# Patient Record
Sex: Female | Born: 1975 | Race: Black or African American | Hispanic: No | Marital: Single | Smoking: Never smoker
Health system: Southern US, Community
[De-identification: ages and names within clinical notes are randomized; demographics above are authoritative.]

## PROBLEM LIST (undated history)

## (undated) DIAGNOSIS — I10 Essential (primary) hypertension: Secondary | ICD-10-CM

## (undated) DIAGNOSIS — J189 Pneumonia, unspecified organism: Secondary | ICD-10-CM

## (undated) DIAGNOSIS — I639 Cerebral infarction, unspecified: Secondary | ICD-10-CM

## (undated) DIAGNOSIS — T7840XA Allergy, unspecified, initial encounter: Secondary | ICD-10-CM

## (undated) DIAGNOSIS — Z9289 Personal history of other medical treatment: Secondary | ICD-10-CM

## (undated) DIAGNOSIS — D649 Anemia, unspecified: Secondary | ICD-10-CM

## (undated) DIAGNOSIS — R87619 Unspecified abnormal cytological findings in specimens from cervix uteri: Secondary | ICD-10-CM

## (undated) DIAGNOSIS — N63 Unspecified lump in unspecified breast: Secondary | ICD-10-CM

## (undated) DIAGNOSIS — R51 Headache: Secondary | ICD-10-CM

## (undated) DIAGNOSIS — G43909 Migraine, unspecified, not intractable, without status migrainosus: Secondary | ICD-10-CM

## (undated) HISTORY — DX: Unspecified abnormal cytological findings in specimens from cervix uteri: R87.619

## (undated) HISTORY — DX: Anemia, unspecified: D64.9

## (undated) HISTORY — DX: Personal history of other medical treatment: Z92.89

## (undated) HISTORY — DX: Cerebral infarction, unspecified: I63.9

## (undated) HISTORY — DX: Unspecified lump in unspecified breast: N63.0

## (undated) HISTORY — DX: Migraine, unspecified, not intractable, without status migrainosus: G43.909

## (undated) HISTORY — DX: Allergy, unspecified, initial encounter: T78.40XA

## (undated) HISTORY — PX: WISDOM TOOTH EXTRACTION: SHX21

---

## 2002-04-02 HISTORY — PX: COLPOSCOPY: SHX161

## 2007-06-24 ENCOUNTER — Emergency Department (HOSPITAL_COMMUNITY): Admission: EM | Admit: 2007-06-24 | Discharge: 2007-06-25 | Payer: Self-pay | Admitting: Emergency Medicine

## 2008-01-16 ENCOUNTER — Ambulatory Visit: Payer: Self-pay | Admitting: Nurse Practitioner

## 2008-01-16 DIAGNOSIS — D649 Anemia, unspecified: Secondary | ICD-10-CM

## 2008-01-19 ENCOUNTER — Encounter (INDEPENDENT_AMBULATORY_CARE_PROVIDER_SITE_OTHER): Payer: Self-pay | Admitting: Nurse Practitioner

## 2008-01-19 LAB — CONVERTED CEMR LAB
Basophils Absolute: 0 10*3/uL (ref 0.0–0.1)
Basophils Relative: 0 % (ref 0–1)
Eosinophils Relative: 1 % (ref 0–5)
HCT: 34.8 % — ABNORMAL LOW (ref 36.0–46.0)
Hemoglobin: 10.8 g/dL — ABNORMAL LOW (ref 12.0–15.0)
Iron: 38 ug/dL — ABNORMAL LOW (ref 42–145)
MCHC: 31 g/dL (ref 30.0–36.0)
Monocytes Absolute: 0.5 10*3/uL (ref 0.1–1.0)
Neutro Abs: 4.1 10*3/uL (ref 1.7–7.7)
RDW: 14.5 % (ref 11.5–15.5)
TIBC: 484 ug/dL — ABNORMAL HIGH (ref 250–470)

## 2008-04-02 DIAGNOSIS — N63 Unspecified lump in unspecified breast: Secondary | ICD-10-CM

## 2008-04-02 HISTORY — DX: Unspecified lump in unspecified breast: N63.0

## 2008-05-13 ENCOUNTER — Ambulatory Visit: Payer: Self-pay | Admitting: Nurse Practitioner

## 2008-05-13 DIAGNOSIS — N764 Abscess of vulva: Secondary | ICD-10-CM

## 2008-05-13 DIAGNOSIS — T148XXA Other injury of unspecified body region, initial encounter: Secondary | ICD-10-CM | POA: Insufficient documentation

## 2008-05-13 LAB — CONVERTED CEMR LAB
Basophils Relative: 0 % (ref 0–1)
Eosinophils Absolute: 0 10*3/uL (ref 0.0–0.7)
Eosinophils Relative: 0 % (ref 0–5)
HCT: 35.9 % — ABNORMAL LOW (ref 36.0–46.0)
Hemoglobin: 11.3 g/dL — ABNORMAL LOW (ref 12.0–15.0)
MCHC: 31.5 g/dL (ref 30.0–36.0)
MCV: 85.5 fL (ref 78.0–100.0)
Monocytes Absolute: 0.4 10*3/uL (ref 0.1–1.0)
Monocytes Relative: 7 % (ref 3–12)
Neutro Abs: 3 10*3/uL (ref 1.7–7.7)
RBC: 4.2 M/uL (ref 3.87–5.11)

## 2008-05-14 ENCOUNTER — Encounter (INDEPENDENT_AMBULATORY_CARE_PROVIDER_SITE_OTHER): Payer: Self-pay | Admitting: Nurse Practitioner

## 2008-06-30 ENCOUNTER — Ambulatory Visit: Payer: Self-pay | Admitting: Nurse Practitioner

## 2008-06-30 ENCOUNTER — Encounter (INDEPENDENT_AMBULATORY_CARE_PROVIDER_SITE_OTHER): Payer: Self-pay | Admitting: Nurse Practitioner

## 2008-06-30 ENCOUNTER — Other Ambulatory Visit: Admission: RE | Admit: 2008-06-30 | Discharge: 2008-06-30 | Payer: Self-pay | Admitting: Internal Medicine

## 2008-06-30 DIAGNOSIS — N63 Unspecified lump in unspecified breast: Secondary | ICD-10-CM

## 2008-06-30 LAB — CONVERTED CEMR LAB
ALT: 10 U/L
AST: 17 U/L
Albumin: 4.4 g/dL
Alkaline Phosphatase: 42 U/L
BUN: 13 mg/dL
Basophils Absolute: 0 K/uL
Basophils Relative: 0 %
Bilirubin Urine: NEGATIVE
CO2: 21 meq/L
Calcium: 9.7 mg/dL
Chlamydia, DNA Probe: NEGATIVE
Chloride: 108 meq/L
Cholesterol: 187 mg/dL
Creatinine, Ser: 1.01 mg/dL
Eosinophils Absolute: 0.1 K/uL
Eosinophils Relative: 1 %
GC Probe Amp, Genital: NEGATIVE
Glucose, Bld: 87 mg/dL
Glucose, Urine, Semiquant: NEGATIVE
HCT: 36 %
HDL: 59 mg/dL
Hemoglobin: 12.2 g/dL
Iron: 128 ug/dL
KOH Prep: NEGATIVE
Ketones, urine, test strip: NEGATIVE
LDL Cholesterol: 115 mg/dL — ABNORMAL HIGH
Lymphocytes Relative: 38 %
Lymphs Abs: 2.2 K/uL
MCHC: 33.9 g/dL
MCV: 85.9 fL
Monocytes Absolute: 0.4 K/uL
Monocytes Relative: 7 %
Neutro Abs: 3 K/uL
Neutrophils Relative %: 54 %
Nitrite: NEGATIVE
Platelets: 233 K/uL
Potassium: 4.3 meq/L
RBC: 4.19 M/uL
RDW: 15.6 % — ABNORMAL HIGH
Saturation Ratios: 32 %
Sodium: 141 meq/L
TIBC: 397 ug/dL
TSH: 0.96 u[IU]/mL
Total Bilirubin: 1.1 mg/dL
Total CHOL/HDL Ratio: 3.2
Total Protein: 7.9 g/dL
Triglycerides: 64 mg/dL
UIBC: 269 ug/dL
VLDL: 13 mg/dL
WBC: 5.7 10*3/microliter
pH: 6

## 2008-07-01 ENCOUNTER — Encounter (INDEPENDENT_AMBULATORY_CARE_PROVIDER_SITE_OTHER): Payer: Self-pay | Admitting: Nurse Practitioner

## 2008-07-01 ENCOUNTER — Telehealth (INDEPENDENT_AMBULATORY_CARE_PROVIDER_SITE_OTHER): Payer: Self-pay | Admitting: Nurse Practitioner

## 2008-07-06 ENCOUNTER — Encounter: Admission: RE | Admit: 2008-07-06 | Discharge: 2008-07-06 | Payer: Self-pay | Admitting: Family Medicine

## 2008-11-13 ENCOUNTER — Emergency Department (HOSPITAL_COMMUNITY): Admission: EM | Admit: 2008-11-13 | Discharge: 2008-11-13 | Payer: Self-pay | Admitting: Emergency Medicine

## 2008-12-31 ENCOUNTER — Ambulatory Visit: Payer: Self-pay | Admitting: Nurse Practitioner

## 2008-12-31 DIAGNOSIS — N3000 Acute cystitis without hematuria: Secondary | ICD-10-CM | POA: Insufficient documentation

## 2008-12-31 LAB — CONVERTED CEMR LAB
Glucose, Urine, Semiquant: NEGATIVE
Nitrite: NEGATIVE
Protein, U semiquant: >=300
Specific Gravity, Urine: 1.025
pH: 7

## 2009-01-01 ENCOUNTER — Encounter (INDEPENDENT_AMBULATORY_CARE_PROVIDER_SITE_OTHER): Payer: Self-pay | Admitting: Nurse Practitioner

## 2009-02-10 ENCOUNTER — Ambulatory Visit: Payer: Self-pay | Admitting: Nurse Practitioner

## 2009-02-10 DIAGNOSIS — R3129 Other microscopic hematuria: Secondary | ICD-10-CM

## 2009-02-10 LAB — CONVERTED CEMR LAB
Ketones, urine, test strip: NEGATIVE
Nitrite: NEGATIVE
Specific Gravity, Urine: 1.01
Urobilinogen, UA: 0.2
WBC Urine, dipstick: NEGATIVE

## 2009-02-12 ENCOUNTER — Encounter (INDEPENDENT_AMBULATORY_CARE_PROVIDER_SITE_OTHER): Payer: Self-pay | Admitting: Nurse Practitioner

## 2009-03-18 ENCOUNTER — Telehealth (INDEPENDENT_AMBULATORY_CARE_PROVIDER_SITE_OTHER): Payer: Self-pay | Admitting: Nurse Practitioner

## 2009-03-22 ENCOUNTER — Telehealth (INDEPENDENT_AMBULATORY_CARE_PROVIDER_SITE_OTHER): Payer: Self-pay | Admitting: Nurse Practitioner

## 2009-04-15 ENCOUNTER — Ambulatory Visit: Payer: Self-pay | Admitting: Internal Medicine

## 2009-04-15 LAB — CONVERTED CEMR LAB
Glucose, Urine, Semiquant: NEGATIVE
Nitrite: NEGATIVE
Protein, U semiquant: NEGATIVE
Specific Gravity, Urine: 1.02
Urobilinogen, UA: 0.2

## 2009-04-16 ENCOUNTER — Encounter (INDEPENDENT_AMBULATORY_CARE_PROVIDER_SITE_OTHER): Payer: Self-pay | Admitting: Nurse Practitioner

## 2009-04-18 ENCOUNTER — Encounter (INDEPENDENT_AMBULATORY_CARE_PROVIDER_SITE_OTHER): Payer: Self-pay | Admitting: Nurse Practitioner

## 2009-04-19 ENCOUNTER — Ambulatory Visit: Payer: Self-pay | Admitting: Nurse Practitioner

## 2009-04-20 ENCOUNTER — Encounter (INDEPENDENT_AMBULATORY_CARE_PROVIDER_SITE_OTHER): Payer: Self-pay | Admitting: Nurse Practitioner

## 2009-04-26 ENCOUNTER — Telehealth (INDEPENDENT_AMBULATORY_CARE_PROVIDER_SITE_OTHER): Payer: Self-pay | Admitting: Nurse Practitioner

## 2009-04-26 ENCOUNTER — Ambulatory Visit (HOSPITAL_COMMUNITY): Admission: RE | Admit: 2009-04-26 | Discharge: 2009-04-26 | Payer: Self-pay | Admitting: Internal Medicine

## 2009-04-26 DIAGNOSIS — D259 Leiomyoma of uterus, unspecified: Secondary | ICD-10-CM

## 2009-06-09 ENCOUNTER — Encounter (INDEPENDENT_AMBULATORY_CARE_PROVIDER_SITE_OTHER): Payer: Self-pay | Admitting: Nurse Practitioner

## 2009-06-15 ENCOUNTER — Encounter (INDEPENDENT_AMBULATORY_CARE_PROVIDER_SITE_OTHER): Payer: Self-pay | Admitting: Nurse Practitioner

## 2010-05-02 NOTE — Letter (Signed)
Summary: MED/SOLUTIONS/APPROVED  MED/SOLUTIONS/APPROVED   Imported By: Arta Bruce 05/30/2009 12:43:32  _____________________________________________________________________  External Attachment:    Type:   Image     Comment:   External Document

## 2010-05-02 NOTE — Letter (Signed)
[facility]   Imported By: Arta Bruce 08/15/2009 15:40:58  _____________________________________________________________________  External Attachment:    Type:   Image     Comment:   External Document

## 2010-05-02 NOTE — Letter (Signed)
[facility]   Imported By: Arta Bruce 09/01/2009 12:55:28  _____________________________________________________________________  External Attachment:    Type:   Image     Comment:   External Document

## 2010-05-02 NOTE — Miscellaneous (Signed)
Summary: Dx update - Urology  Clinical Lists Changes Full visit to be scanned in EMR Medications: Added new medication of METHENAMINE MANDELATE 1 GM TABS (METHENAMINE MANDELATE) Oe tablet by mouth two times a day Added new medication of MACROBID 100 MG CAPS (NITROFURANTOIN MONOHYD MACRO) One tablet by mouth once daily

## 2010-05-02 NOTE — Progress Notes (Signed)
Summary: Referral needed  Phone Note Outgoing Call   Summary of Call: I notifed pt that CT of the abd/pelvis did not show any cause for recurrent UTI's.  Will proceed with referral to urology. She did however have a small fibroid in her uterus which would not be the cause and no need for further workup for it at this time. she will be notified of the time/date of appt. (referral in basket) **Spoke with pt directly** Initial call taken by: Lehman Prom FNP,  April 26, 2009 3:53 PM  Follow-up for Phone Call        spoke with pt and she is informed of information above. Follow-up by: Levon Hedger,  April 29, 2009 3:27 PM  New Problems: FIBROIDS, UTERUS (ICD-218.9)   New Problems: FIBROIDS, UTERUS (ICD-218.9)

## 2010-05-02 NOTE — Letter (Signed)
Summary: MED/SOLUTION//APPROVED  MED/SOLUTION//APPROVED   Imported By: Arta Bruce 05/31/2009 14:43:46  _____________________________________________________________________  External Attachment:    Type:   Image     Comment:   External Document

## 2010-05-02 NOTE — Letter (Signed)
Summary: TEST ORDER FORM/CT//ACUTE CYSTITIS//APPT DATE & TIME  TEST ORDER FORM/CT//ACUTE CYSTITIS//APPT DATE & TIME   Imported By: Arta Bruce 06/01/2009 11:49:51  _____________________________________________________________________  External Attachment:    Type:   Image     Comment:   External Document

## 2010-07-08 LAB — URINALYSIS, MICROSCOPIC ONLY
Bilirubin Urine: NEGATIVE
Glucose, UA: NEGATIVE mg/dL
Ketones, ur: NEGATIVE mg/dL
Nitrite: NEGATIVE
Protein, ur: NEGATIVE mg/dL
Specific Gravity, Urine: 1.006 (ref 1.005–1.030)
pH: 8 (ref 5.0–8.0)

## 2010-07-08 LAB — POCT URINALYSIS DIP (DEVICE)
Nitrite: NEGATIVE
Protein, ur: NEGATIVE mg/dL
pH: 7.5 (ref 5.0–8.0)

## 2010-07-08 LAB — URINE CULTURE

## 2010-07-08 LAB — POCT PREGNANCY, URINE: Preg Test, Ur: NEGATIVE

## 2011-05-09 ENCOUNTER — Other Ambulatory Visit: Payer: Self-pay | Admitting: Obstetrics and Gynecology

## 2011-05-09 DIAGNOSIS — N63 Unspecified lump in unspecified breast: Secondary | ICD-10-CM

## 2011-05-11 ENCOUNTER — Other Ambulatory Visit: Payer: Self-pay

## 2011-05-18 ENCOUNTER — Ambulatory Visit
Admission: RE | Admit: 2011-05-18 | Discharge: 2011-05-18 | Disposition: A | Discharge status: Home / Self Care | Payer: 59 | Source: Ambulatory Visit | Attending: Obstetrics and Gynecology | Admitting: Obstetrics and Gynecology

## 2011-05-18 DIAGNOSIS — N63 Unspecified lump in unspecified breast: Secondary | ICD-10-CM

## 2012-06-25 ENCOUNTER — Encounter: Payer: Self-pay | Admitting: Certified Nurse Midwife

## 2012-06-25 ENCOUNTER — Other Ambulatory Visit: Payer: Self-pay | Admitting: Nurse Practitioner

## 2012-06-25 DIAGNOSIS — D500 Iron deficiency anemia secondary to blood loss (chronic): Secondary | ICD-10-CM

## 2012-06-26 ENCOUNTER — Other Ambulatory Visit: Payer: Self-pay

## 2012-07-01 ENCOUNTER — Telehealth: Payer: Self-pay | Admitting: Certified Nurse Midwife

## 2012-07-01 NOTE — Telephone Encounter (Signed)
pt cx lab appt for cbc at reminder call/called pt and lm to call back to rs/[place]

## 2012-07-03 ENCOUNTER — Other Ambulatory Visit: Payer: Self-pay

## 2012-08-28 ENCOUNTER — Encounter (HOSPITAL_COMMUNITY): Payer: Self-pay | Admitting: Nurse Practitioner

## 2012-08-28 ENCOUNTER — Emergency Department (HOSPITAL_COMMUNITY)
Admission: EM | Admit: 2012-08-28 | Discharge: 2012-08-28 | Disposition: A | Discharge status: Home / Self Care | Payer: Self-pay | Attending: Emergency Medicine | Admitting: Emergency Medicine

## 2012-08-28 DIAGNOSIS — Z862 Personal history of diseases of the blood and blood-forming organs and certain disorders involving the immune mechanism: Secondary | ICD-10-CM | POA: Insufficient documentation

## 2012-08-28 DIAGNOSIS — R51 Headache: Secondary | ICD-10-CM | POA: Insufficient documentation

## 2012-08-28 DIAGNOSIS — Z8742 Personal history of other diseases of the female genital tract: Secondary | ICD-10-CM | POA: Insufficient documentation

## 2012-08-28 DIAGNOSIS — R110 Nausea: Secondary | ICD-10-CM | POA: Insufficient documentation

## 2012-08-28 DIAGNOSIS — Z79899 Other long term (current) drug therapy: Secondary | ICD-10-CM | POA: Insufficient documentation

## 2012-08-28 DIAGNOSIS — R42 Dizziness and giddiness: Secondary | ICD-10-CM | POA: Insufficient documentation

## 2012-08-28 LAB — POCT I-STAT, CHEM 8
Calcium, Ion: 1.25 mmol/L — ABNORMAL HIGH (ref 1.12–1.23)
Creatinine, Ser: 0.9 mg/dL (ref 0.50–1.10)
Glucose, Bld: 87 mg/dL (ref 70–99)
Potassium: 3.8 mEq/L (ref 3.5–5.1)

## 2012-08-28 MED ORDER — DEXAMETHASONE SODIUM PHOSPHATE 10 MG/ML IJ SOLN
10.0000 mg | Freq: Once | INTRAMUSCULAR | Status: AC
  Administered 2012-08-28: 10 mg via INTRAVENOUS
  Filled 2012-08-28: qty 1

## 2012-08-28 MED ORDER — SODIUM CHLORIDE 0.9 % IV BOLUS (SEPSIS)
1000.0000 mL | Freq: Once | INTRAVENOUS | Status: AC
  Administered 2012-08-28: 1000 mL via INTRAVENOUS

## 2012-08-28 MED ORDER — DIAZEPAM 5 MG PO TABS: 5.0000 mg | ORAL_TABLET | Freq: Four times a day (QID) | ORAL | Status: DC | PRN

## 2012-08-28 MED ORDER — KETOROLAC TROMETHAMINE 30 MG/ML IJ SOLN
30.0000 mg | Freq: Once | INTRAMUSCULAR | Status: AC
  Administered 2012-08-28: 30 mg via INTRAVENOUS
  Filled 2012-08-28: qty 1

## 2012-08-28 MED ORDER — HYDROCODONE-ACETAMINOPHEN 5-325 MG PO TABS: ORAL_TABLET | ORAL | Status: DC

## 2012-08-28 NOTE — ED Notes (Signed)
Pt presents with 5 day h/o headache.  Pt reports pain is located on the top of her head, reports area is tender to palpation.  +nausea, -photophobia;  Pt denies any blurred vision, has taken OTC medication without relief, ibuprofen taken at 0700 this am.

## 2012-08-28 NOTE — ED Provider Notes (Signed)
History     CSN: [id number]  Arrival date & time 08/28/12  5784   First MD Initiated Contact with Patient 08/28/12 1108      Chief Complaint  Patient presents with  . Headache    (Consider location/radiation/quality/duration/timing/severity/associated sxs/prior treatment) HPI  Holly Moyer is a 37 y.o. female complaining of headache on the top of her head described as pressure like and burning with TTP on top of head associated with photo and sono sensitivity, nausea , pressure behind both eyes and lightheaded sensation when going from sitting to standing.  Denies trauma, syncope, fever, neck stiffness, rash, change in vision, dysarthria, ataxia, cough, abdominal pain, chest pain, change in bowel or bladder habits. Pt has tried Motrin APAP naproxen with little releif. Indolent onset with maximal intensity (10/10) at 24-36 hours.  Patient has had prior headaches but this one is different in that it has lasted longer than others.  Past Medical History  Diagnosis Date  . Anemia   . Breast mass in female 2010    questionable mass, no f/u (fibroadenoma vs papilloma)    Past Surgical History  Procedure Laterality Date  . Colposcopy  2004  . Wisdom tooth extraction    . Cesarean section      x's 2    Family History  Problem Relation Age of Onset  . Hypertension Father   . Heart disease Father     74's  . Diabetes Maternal Grandmother   . Heart failure Maternal Grandmother   . Hypertension Paternal Grandmother   . Hypertension Paternal Grandfather     History  Substance Use Topics  . Smoking status: Never Smoker   . Smokeless tobacco: Not on file  . Alcohol Use: No    OB History   Grav Para Term Preterm Abortions TAB SAB Ect Mult Living   3 3  1      2       Review of Systems  Constitutional: Negative for fever.  HENT: Negative for neck pain and neck stiffness.   Eyes: Negative for visual disturbance.  Respiratory: Negative for shortness of breath.    Cardiovascular: Negative for chest pain.  Gastrointestinal: Negative for nausea, vomiting, abdominal pain and diarrhea.  Neurological: Positive for light-headedness and headaches. Negative for syncope, speech difficulty and weakness.  All other systems reviewed and are negative.    Allergies  Macrobid  Home Medications   Current Outpatient Rx  Name  Route  Sig  Dispense  Refill  . Ferrous Sulfate (IRON) 325 (65 FE) MG TABS   Oral   Take 1-2 tablets by mouth daily.         Marland Kitchen ibuprofen (ADVIL,MOTRIN) 200 MG tablet   Oral   Take 200-600 mg by mouth 3 (three) times daily as needed for pain.           BP 163/91  Pulse 84  Temp(Src) 98.4 F (36.9 C) (Oral)  Resp 18  Ht 5\' 4"  (1.626 m)  Wt 185 lb (83.915 kg)  BMI 31.74 kg/m2  SpO2 100%  Physical Exam  Nursing note and vitals reviewed. Constitutional: She is oriented to person, place, and time. She appears well-developed and well-nourished. No distress.  HENT:  Head: Normocephalic.  Mouth/Throat: Oropharynx is clear and moist.  Eyes: Conjunctivae and EOM are normal. Pupils are equal, round, and reactive to light.  Neck:  Patient has full range of motion to neck. No tenderness to deep palpation of the posterior cervical spine. Patient can flex chin  to chest with no pain.   Cardiovascular: Normal rate, regular rhythm and normal heart sounds.   Pulmonary/Chest: Effort normal and breath sounds normal. No stridor. No respiratory distress. She has no wheezes. She has no rales.  Abdominal: Soft. There is no tenderness.  Musculoskeletal: Normal range of motion.  Neurological: She is alert and oriented to person, place, and time.  Cranial nerves III through XII intact, strength 5 out of 5x4 extremities, negative pronator drift, finger to nose and heel-to-shin coordinated, sensation intact to pinprick and light touch, gait is coordinated and Romberg is negative.    Skin:  Patient is slightly swollen to top of head with  tenderness to palpation, no signs of trauma, no lymphadenopathy.  Psychiatric: She has a normal mood and affect.    ED Course  Procedures (including critical care time)  Labs Reviewed  POCT I-STAT, CHEM 8 - Abnormal; Notable for the following:    Calcium, Ion 1.25 (*)    Hemoglobin 8.5 (*)    HCT 25.0 (*)    All other components within normal limits  POCT PREGNANCY, URINE   No results found.   1. Headache       MDM    Filed Vitals:   08/28/12 1004  BP: 163/91  Pulse: 84  Temp: 98.4 F (36.9 C)  TempSrc: Oral  Resp: 18  Height: 5\' 4"  (1.626 m)  Weight: 185 lb (83.915 kg)  SpO2: 100%     Holly Moyer is a 37 y.o. female   Non concerning for SAH, ICH, Meningitis, or temporal arteritis. Pt is afebrile with no focal neuro deficits, nuchal rigidity, or change in vision. Patient refuses any pain medication will make her drowsy.  Patient has edematous skin to top of cranium, and her pain is more superficial and coming from the skin rather than true headache. Discussed case with attending who agrees with plan and stability to d/c to home. Pt verbalizes understanding and is agreeable with plan to dc.    Medications  sodium chloride 0.9 % bolus 1,000 mL (0 mLs Intravenous Stopped 08/28/12 1344)  dexamethasone (DECADRON) injection 10 mg (10 mg Intravenous Given 08/28/12 1306)  ketorolac (TORADOL) 30 MG/ML injection 30 mg (30 mg Intravenous Given 08/28/12 1303)    The patient is hemodynamically stable, appropriate for, and amenable to, discharge at this time. Pt verbalized understanding and agrees with care plan. Outpatient follow-up and return precautions given.    New Prescriptions   DIAZEPAM (VALIUM) 5 MG TABLET    Take 1 tablet (5 mg total) by mouth every 6 (six) hours as needed (muscle spasms).   HYDROCODONE-ACETAMINOPHEN (NORCO/VICODIN) 5-325 MG PER TABLET    Take 1-2 tablets by mouth every 6 hours as needed for pain.           Wynetta Emery, PA-C 08/28/12  1825

## 2012-08-28 NOTE — ED Notes (Addendum)
Headache since Sunday, unrelieved with otc meds. Has felt nauseated and dizzy also. Hx headaches but they usually dont last this olong. A&Ox4, resp e/u

## 2012-09-01 NOTE — ED Provider Notes (Signed)
Medical screening examination/treatment/procedure(s) were performed by non-physician practitioner and as supervising physician I was immediately available for consultation/collaboration.   Loren Racer, MD 09/01/12 [phone]

## 2013-02-19 ENCOUNTER — Emergency Department (HOSPITAL_COMMUNITY): Payer: BC Managed Care – PPO

## 2013-02-19 ENCOUNTER — Observation Stay (HOSPITAL_COMMUNITY)
Admission: EM | Admit: 2013-02-19 | Discharge: 2013-02-20 | Disposition: A | Discharge status: Home / Self Care | Payer: BC Managed Care – PPO | Attending: Internal Medicine | Admitting: Internal Medicine

## 2013-02-19 ENCOUNTER — Encounter (HOSPITAL_COMMUNITY): Payer: Self-pay | Admitting: Emergency Medicine

## 2013-02-19 DIAGNOSIS — R079 Chest pain, unspecified: Secondary | ICD-10-CM | POA: Diagnosis present

## 2013-02-19 DIAGNOSIS — N63 Unspecified lump in unspecified breast: Secondary | ICD-10-CM

## 2013-02-19 DIAGNOSIS — D649 Anemia, unspecified: Principal | ICD-10-CM | POA: Diagnosis present

## 2013-02-19 DIAGNOSIS — D259 Leiomyoma of uterus, unspecified: Secondary | ICD-10-CM

## 2013-02-19 DIAGNOSIS — R0609 Other forms of dyspnea: Secondary | ICD-10-CM | POA: Diagnosis present

## 2013-02-19 DIAGNOSIS — R0989 Other specified symptoms and signs involving the circulatory and respiratory systems: Secondary | ICD-10-CM | POA: Insufficient documentation

## 2013-02-19 DIAGNOSIS — T148XXA Other injury of unspecified body region, initial encounter: Secondary | ICD-10-CM

## 2013-02-19 DIAGNOSIS — N898 Other specified noninflammatory disorders of vagina: Secondary | ICD-10-CM | POA: Insufficient documentation

## 2013-02-19 DIAGNOSIS — R5381 Other malaise: Secondary | ICD-10-CM | POA: Insufficient documentation

## 2013-02-19 DIAGNOSIS — N920 Excessive and frequent menstruation with regular cycle: Secondary | ICD-10-CM | POA: Diagnosis present

## 2013-02-19 DIAGNOSIS — N764 Abscess of vulva: Secondary | ICD-10-CM

## 2013-02-19 DIAGNOSIS — R3129 Other microscopic hematuria: Secondary | ICD-10-CM

## 2013-02-19 DIAGNOSIS — R51 Headache: Secondary | ICD-10-CM | POA: Insufficient documentation

## 2013-02-19 DIAGNOSIS — R0602 Shortness of breath: Secondary | ICD-10-CM | POA: Insufficient documentation

## 2013-02-19 DIAGNOSIS — N3000 Acute cystitis without hematuria: Secondary | ICD-10-CM

## 2013-02-19 HISTORY — DX: Pneumonia, unspecified organism: J18.9

## 2013-02-19 HISTORY — DX: Headache: R51

## 2013-02-19 HISTORY — DX: Essential (primary) hypertension: I10

## 2013-02-19 LAB — POCT I-STAT TROPONIN I: Troponin i, poc: 0 ng/mL (ref 0.00–0.08)

## 2013-02-19 LAB — BASIC METABOLIC PANEL
BUN: 12 mg/dL (ref 6–23)
CO2: 25 mEq/L (ref 19–32)
Calcium: 9.2 mg/dL (ref 8.4–10.5)
Glucose, Bld: 88 mg/dL (ref 70–99)
Potassium: 3.7 mEq/L (ref 3.5–5.1)
Sodium: 140 mEq/L (ref 135–145)

## 2013-02-19 LAB — PREPARE RBC (CROSSMATCH)

## 2013-02-19 LAB — CBC
HCT: 20.3 % — ABNORMAL LOW (ref 36.0–46.0)
Hemoglobin: 5.7 g/dL — CL (ref 12.0–15.0)
MCH: 16.4 pg — ABNORMAL LOW (ref 26.0–34.0)
RBC: 3.48 MIL/uL — ABNORMAL LOW (ref 3.87–5.11)

## 2013-02-19 LAB — ABO/RH: ABO/RH(D): A POS

## 2013-02-19 MED ORDER — PANTOPRAZOLE SODIUM 40 MG IV SOLR
40.0000 mg | INTRAVENOUS | Status: DC
  Administered 2013-02-20: 40 mg via INTRAVENOUS
  Filled 2013-02-19 (×2): qty 40

## 2013-02-19 MED ORDER — ACETAMINOPHEN 325 MG PO TABS: 650.0000 mg | ORAL_TABLET | Freq: Four times a day (QID) | ORAL | Status: DC | PRN

## 2013-02-19 MED ORDER — VITAMIN B-12 1000 MCG PO TABS
1000.0000 ug | ORAL_TABLET | Freq: Every day | ORAL | Status: DC
  Administered 2013-02-20: 1000 ug via ORAL
  Filled 2013-02-19: qty 1

## 2013-02-19 MED ORDER — ACETAMINOPHEN 650 MG RE SUPP: 650.0000 mg | Freq: Four times a day (QID) | RECTAL | Status: DC | PRN

## 2013-02-19 MED ORDER — FERROUS SULFATE 325 (65 FE) MG PO TABS
325.0000 mg | ORAL_TABLET | Freq: Three times a day (TID) | ORAL | Status: DC
  Administered 2013-02-20 (×2): 325 mg via ORAL
  Filled 2013-02-19 (×4): qty 1

## 2013-02-19 MED ORDER — FOLIC ACID 1 MG PO TABS
1.0000 mg | ORAL_TABLET | Freq: Every day | ORAL | Status: DC
  Administered 2013-02-20: 1 mg via ORAL
  Filled 2013-02-19: qty 1

## 2013-02-19 MED ORDER — SODIUM CHLORIDE 0.9 % IJ SOLN
3.0000 mL | Freq: Two times a day (BID) | INTRAMUSCULAR | Status: DC
  Administered 2013-02-20 (×2): 3 mL via INTRAVENOUS

## 2013-02-19 NOTE — H&P (Signed)
[facility] History and Physical  Patient: Holly Moyer  RUE:[id number]  DOB: 1975-07-04  DOS: the patient was seen and examined on 02/19/2013 PCP: No PCP Per Patient  Chief Complaint: Shortness of breath and chest pain  HPI: Holly Moyer is a 37 y.o. female with Past medical history of iron deficiency anemia secondary to menstruation was. The patient is coming from home. The patient is presenting with complaints of shortness of breath on exertion associated with chest pain that has been ongoing since last few weeks and progressively worsening. She mentions that the pain was severe today located in her center of the chest and radiating to her neck and arm and felt like pressure and cramps. She also had shortness of breath which was also progressing. She denied any fever, chills, cough, nausea, vomiting, diarrhea, active bleeding, black color bowel movements, blood in urine, leg swelling, orthopnea, PND. She mentions that she had on and off similar symptoms in the past but not to this extent. She mentions that 13 years ago after her pregnancy she was recommended iron supplements for iron deficiency anemia. She mentions that she continues to have heavy menstrual periods requiring pads changing every few hours initially. She mentions that since last 6 months he has not been regularly taking the iron supplements.  Review of Systems: as mentioned in the history of present illness.  A Comprehensive review of the other systems is negative.  Past Medical History  Diagnosis Date  . Anemia   . Breast mass in female 2010    questionable mass, no f/u (fibroadenoma vs papilloma)   Past Surgical History  Procedure Laterality Date  . Colposcopy  2004  . Wisdom tooth extraction    . Cesarean section      x's 2   Social History:  reports that she has never smoked. She does not have any smokeless tobacco history on file. She reports that she does not drink alcohol or use illicit  drugs. Independent for most of her  ADL.  Allergies  Allergen Reactions  . Macrobid [Nitrofurantoin Macrocrystal] Itching    Family History  Problem Relation Age of Onset  . Hypertension Father   . Heart disease Father     67's  . Diabetes Maternal Grandmother   . Heart failure Maternal Grandmother   . Hypertension Paternal Grandmother   . Hypertension Paternal Grandfather     Prior to Admission medications   Medication Sig Start Date End Date Taking? Authorizing Provider  acetaminophen (TYLENOL) 500 MG tablet Take 1,000 mg by mouth every 6 (six) hours as needed for moderate pain.   Yes Historical Provider, MD  aspirin EC 81 MG tablet Take 81 mg by mouth daily.   Yes Historical Provider, MD  GARLIC PO Take 1 tablet by mouth daily.   Yes Historical Provider, MD  ibuprofen (ADVIL,MOTRIN) 200 MG tablet Take 400 mg by mouth daily as needed.    Yes Historical Provider, MD    Physical Exam: Filed Vitals:   02/19/13 1733 02/19/13 1902 02/19/13 2000 02/19/13 2020  BP: 133/86 130/81 121/75   Pulse: 89 76 72   Temp: 99.1 F (37.3 C)   98.2 F (36.8 C)  TempSrc: Oral   Oral  Resp: 16 18 14    SpO2: 100%  100%     General: Alert, Awake and Oriented to Time, Place and Person. Appear in moderate distress Eyes: PERRL ENT: Oral Mucosa clear, pallor evident . Neck: No  JVD Cardiovascular: S1 and S2 Present, no  Murmur, Peripheral Pulses Present Respiratory: Bilateral Air entry equal and Decreased, Clear to Auscultation,  No  Crackles,no  wheezes Abdomen: Bowel Sound Present, Soft and Non tender Skin: No  Rash Extremities: No  Pedal edema, no  calf tenderness Neurologic: Grossly Unremarkable.  Labs on Admission:  CBC:  Recent Labs Lab 02/19/13 1738  WBC 8.2  HGB 5.7*  HCT 20.3*  MCV 58.3*  PLT 648*    CMP     Component Value Date/Time   NA 140 02/19/2013 1738   K 3.7 02/19/2013 1738   CL 105 02/19/2013 1738   CO2 25 02/19/2013 1738   GLUCOSE 88 02/19/2013 1738    BUN 12 02/19/2013 1738   CREATININE 0.99 02/19/2013 1738   CALCIUM 9.2 02/19/2013 1738   PROT 7.9 06/30/2008 2156   ALBUMIN 4.4 06/30/2008 2156   AST 17 06/30/2008 2156   ALT 10 06/30/2008 2156   ALKPHOS 42 06/30/2008 2156   BILITOT 1.1 06/30/2008 2156   GFRNONAA 72* 02/19/2013 1738   GFRAA 84* 02/19/2013 1738    No results found for this basename: LIPASE, AMYLASE,  in the last 168 hours No results found for this basename: AMMONIA,  in the last 168 hours  No results found for this basename: CKTOTAL, CKMB, CKMBINDEX, TROPONINI,  in the last 168 hours BNP (last 3 results)  Recent Labs  02/19/13 1738  PROBNP 125.8*    Radiological Exams on Admission: Dg Chest 2 View  02/19/2013   CLINICAL DATA:  Left chest pain.  Back pain.  EXAM: CHEST  2 VIEW  COMPARISON:  04/26/2009  FINDINGS: The heart size and mediastinal contours are within normal limits. Both lungs are clear. The visualized skeletal structures are unremarkable.  IMPRESSION: No active cardiopulmonary disease.   Electronically Signed   By: Herbie Baltimore M.D.   On: 02/19/2013 18:37    EKG: Independently reviewed. normal EKG, normal sinus rhythm,  Assessment/Plan Principal Problem:   ANEMIA Active Problems:   Menorrhagia   Chest pain   DOE (dyspnea on exertion)   1. Anemia, unspecified the patient is presenting with chest pain and shortness of breath. Her initial troponin and EKG are negative for any acute ischemia. She is found to have severely anemic with hemoglobin of 5.7 associated with microcytosis. More likely she has iron deficiency anemia due to chronic blood loss due to menorrhagia. She denies any active bleeding from GI tract or black color bowel movements, at present she is refusing Hemoccult testing. Considering her presentation she is receiving 2 units of PRBC. Recheck in H&H after the blood transfusion. I would also check a Hemoccult of stools specimen. I will get iron, TIBC, ferritin, TSH, B12, folic acid  levels checked. I start her on iron supplements 3 times a day as well as folic acid and B12 from tomorrow.she has been recommended to remain compliant with the treatment for long-term. Considering her significant menorrhagia she may require referral to OB/GYN as an outpatient. Currently there is no evidence of GI bleeding, if Hemoccult testing is positive then she may require a GI consultation.   I will keep her on clear liquid diet until the nature of her anemia is clear.  2. chest pain and shortness of breath  Her EKG and chest x-ray does not show any acute abnormality. More likely her anemia is the cause of ongoing chest pain and shortness of breath she is currently chest pain-free , I will get serial troponins. I would also monitor on telemetry  DVT Prophylaxis: mechanical compression device Nutrition:  clear liquid diet   Code Status:  full   Family Communication:  mother  was present at bedside, opportunity was given to ask question and all questions were answered satisfactorily at the time of interview. Disposition: Admitted to inpatient in telemetry unit.  Author: Lynden Oxford, MD [facility] Pager: [phone] 02/19/2013, 8:37 PM    If 7PM-7AM, please contact night-coverage [email or web address] Password [email or web address]

## 2013-02-19 NOTE — ED Provider Notes (Signed)
CSN: [id number]     Arrival date & time 02/19/13  1728 History   First MD Initiated Contact with Patient 02/19/13 1837     Chief Complaint  Patient presents with  . Chest Pain   (Consider location/radiation/quality/duration/timing/severity/associated sxs/prior Treatment) HPI Comments: 37 year old female presents with worsening dyspnea on exertion and chest pain on exertion for the past several months. She is also noticed having a headache recently. She feels like the chest pain as a pressure in her left chest radiates to her neck and her arm. This seems to come and go with exertion. She's never felt this way before but progressed to the point where she felt she needed to be evaluated. She has a history of anemia which is supposed to be on iron but has not taken her iron pills in several months. Denies any hematochezia or melena. She has had a history of heavy vaginal bleeding that has not changed recently. Her last period was one week ago.    Past Medical History  Diagnosis Date  . Anemia   . Breast mass in female 2010    questionable mass, no f/u (fibroadenoma vs papilloma)   Past Surgical History  Procedure Laterality Date  . Colposcopy  2004  . Wisdom tooth extraction    . Cesarean section      x's 2   Family History  Problem Relation Age of Onset  . Hypertension Father   . Heart disease Father     93's  . Diabetes Maternal Grandmother   . Heart failure Maternal Grandmother   . Hypertension Paternal Grandmother   . Hypertension Paternal Grandfather    History  Substance Use Topics  . Smoking status: Never Smoker   . Smokeless tobacco: Not on file  . Alcohol Use: No   OB History   Grav Para Term Preterm Abortions TAB SAB Ect Mult Living   3 3  1      2      Review of Systems  Constitutional: Positive for fatigue.  Respiratory: Positive for shortness of breath.   Cardiovascular: Positive for chest pain.  Gastrointestinal: Negative for nausea, vomiting, abdominal pain  and blood in stool.  Genitourinary: Positive for vaginal bleeding.  Neurological: Positive for headaches.  All other systems reviewed and are negative.    Allergies  Macrobid  Home Medications   Current Outpatient Rx  Name  Route  Sig  Dispense  Refill  . acetaminophen (TYLENOL) 500 MG tablet   Oral   Take 1,000 mg by mouth every 6 (six) hours as needed for moderate pain.         Marland Kitchen aspirin EC 81 MG tablet   Oral   Take 81 mg by mouth daily.         Marland Kitchen GARLIC PO   Oral   Take 1 tablet by mouth daily.         Marland Kitchen ibuprofen (ADVIL,MOTRIN) 200 MG tablet   Oral   Take 400 mg by mouth daily as needed.           BP 130/81  Pulse 76  Temp(Src) 99.1 F (37.3 C) (Oral)  Resp 18  SpO2 100%  LMP 02/12/2013 Physical Exam  Nursing note and vitals reviewed. Constitutional: She is oriented to person, place, and time. She appears well-developed and well-nourished. No distress.  HENT:  Head: Normocephalic and atraumatic.  Right Ear: External ear normal.  Left Ear: External ear normal.  Nose: Nose normal.  Eyes: Right eye exhibits no  discharge. Left eye exhibits no discharge.  Cardiovascular: Normal rate, regular rhythm and normal heart sounds.   Pulmonary/Chest: Effort normal and breath sounds normal.  Abdominal: Soft. She exhibits no distension. There is no tenderness.  Neurological: She is alert and oriented to person, place, and time.  Skin: Skin is warm and dry.    ED Course  Procedures (including critical care time) Labs Review Labs Reviewed  CBC - Abnormal; Notable for the following:    RBC 3.48 (*)    Hemoglobin 5.7 (*)    HCT 20.3 (*)    MCV 58.3 (*)    MCH 16.4 (*)    MCHC 28.1 (*)    RDW 21.7 (*)    Platelets 648 (*)    All other components within normal limits  BASIC METABOLIC PANEL - Abnormal; Notable for the following:    GFR calc non Af Amer 72 (*)    GFR calc Af Amer 84 (*)    All other components within normal limits  PRO B NATRIURETIC  PEPTIDE - Abnormal; Notable for the following:    Pro B Natriuretic peptide (BNP) 125.8 (*)    All other components within normal limits  POCT I-STAT TROPONIN I  PREPARE RBC (CROSSMATCH)  TYPE AND SCREEN   Imaging Review Dg Chest 2 View  02/19/2013   CLINICAL DATA:  Left chest pain.  Back pain.  EXAM: CHEST  2 VIEW  COMPARISON:  04/26/2009  FINDINGS: The heart size and mediastinal contours are within normal limits. Both lungs are clear. The visualized skeletal structures are unremarkable.  IMPRESSION: No active cardiopulmonary disease.   Electronically Signed   By: Herbie Baltimore M.D.   On: 02/19/2013 18:37    EKG Interpretation    Date/Time:  Thursday February 19 2013 17:30:00 EST Ventricular Rate:  90 PR Interval:  130 QRS Duration: 72 QT Interval:  346 QTC Calculation: 423 R Axis:   81 Text Interpretation:  Normal sinus rhythm Nonspecific T wave abnormality Abnormal ECG No old tracing to compare Confirmed by Renley Gutman  MD, Clementina Mareno ([id number]) on 02/19/2013 6:36:54 PM            MDM   1. Anemia    Patient here with symptomatic anemia. Likely is a combination of her heavy periods and her being off iron. Her vital signs are normal. She is nonspecific EKG but no evidence of an MI. Will type and cross and order a blood his fusion. Due to her anemia she will need admission to the hospitalist.    Audree Camel, MD 02/19/13 2007

## 2013-02-19 NOTE — ED Notes (Signed)
Pt refused occult blood stool collected.

## 2013-02-19 NOTE — ED Notes (Signed)
Pt in c/o intermittent chest pain and shortness of breath with exertion over the last few months, states the symptoms have been consistent and have gotten progressively worse, states they are worse with exertion, states she had been putting off being evaluated for this and today tried to make a doctors appointment and they advised her to come to the emergency room. Pt states there have been no changes today in her symptoms.

## 2013-02-19 NOTE — ED Notes (Addendum)
Matthew Folks Rn informed of critical lab value. Hemoglobin 5.7

## 2013-02-20 ENCOUNTER — Observation Stay (HOSPITAL_COMMUNITY): Payer: BC Managed Care – PPO

## 2013-02-20 ENCOUNTER — Telehealth: Payer: Self-pay | Admitting: Certified Nurse Midwife

## 2013-02-20 ENCOUNTER — Encounter (HOSPITAL_COMMUNITY): Payer: Self-pay | Admitting: *Deleted

## 2013-02-20 LAB — TROPONIN I
Troponin I: <0.3 ng/mL (ref <0.30)
Troponin I: <0.3 ng/mL (ref <0.30)

## 2013-02-20 LAB — IRON AND TIBC
TIBC: 463 ug/dL (ref 250–470)
UIBC: 430 ug/dL — ABNORMAL HIGH (ref 125–400)

## 2013-02-20 LAB — VITAMIN B12: Vitamin B-12: 482 pg/mL (ref 211–911)

## 2013-02-20 LAB — FERRITIN: Ferritin: <1 ng/mL — ABNORMAL LOW (ref 10–291)

## 2013-02-20 LAB — HEMOGLOBIN AND HEMATOCRIT, BLOOD: HCT: 26.4 % — ABNORMAL LOW (ref 36.0–46.0)

## 2013-02-20 LAB — FOLATE: Folate: >20 ng/mL

## 2013-02-20 LAB — LACTATE DEHYDROGENASE: LDH: 190 U/L (ref 94–250)

## 2013-02-20 MED ORDER — DOCUSATE SODIUM 100 MG PO CAPS
100.0000 mg | ORAL_CAPSULE | Freq: Two times a day (BID) | ORAL | Status: DC
  Administered 2013-02-20: 100 mg via ORAL
  Filled 2013-02-20: qty 1

## 2013-02-20 MED ORDER — DSS 100 MG PO CAPS: 100.0000 mg | ORAL_CAPSULE | Freq: Two times a day (BID) | ORAL | Status: DC

## 2013-02-20 MED ORDER — SODIUM CHLORIDE 0.9 % IV SOLN
500.0000 mg | Freq: Once | INTRAVENOUS | Status: AC
  Administered 2013-02-20: 13:00:00 500 mg via INTRAVENOUS
  Filled 2013-02-20: qty 10

## 2013-02-20 MED ORDER — SODIUM CHLORIDE 0.9 % IV SOLN
25.0000 mg | Freq: Once | INTRAVENOUS | Status: AC
  Administered 2013-02-20: 12:00:00 25 mg via INTRAVENOUS
  Filled 2013-02-20: qty 0.5

## 2013-02-20 MED ORDER — FERROUS SULFATE 325 (65 FE) MG PO TABS: 325.0000 mg | ORAL_TABLET | Freq: Two times a day (BID) | ORAL | Status: DC

## 2013-02-20 NOTE — Telephone Encounter (Signed)
Scheduled f/u hospital visit with Dr. Farrel Gobble for Monday 11/24 at 6 PM. PUS resulted in system. Patient will be dc from hospital today.

## 2013-02-20 NOTE — Telephone Encounter (Signed)
Message left to return call to [place] at [phone].   Chart to Dr. Liliana Cline desk.

## 2013-02-20 NOTE — Telephone Encounter (Signed)
Please pull her chart, there is nothing in Epic

## 2013-02-20 NOTE — Telephone Encounter (Signed)
Holly Moyer from [facility] called to inform us this mutual patient will need an appointment with [place] at [facility] in the next to weeks for follow up on menorrhagia and low hemoglobin. The patient was recently seen at the ED for chest pain, menorrhagia, and low hemoglobin. Please call patient to schedule an appointment?

## 2013-02-20 NOTE — Discharge Summary (Signed)
Physician Discharge Summary  Danel Studzinski WJX:[id number] DOB: 03/25/1976 DOA: 02/19/2013  PCP: No PCP Per Patient  Admit date: 02/19/2013 Discharge date: 02/20/2013  Time spent: 30 minutes  Recommendations for Outpatient Follow-up:  Patient with chest pain from severe anemia.   Anemia felt to be due to uterine fibroids.  Patient needs GYN recommendations.   Discharge Diagnoses:  Principal Problem:   ANEMIA Active Problems:   Menorrhagia   Chest pain   DOE (dyspnea on exertion)   Discharge Condition: stable.  Diet recommendation: regular diet  Filed Weights   02/19/13 2154  Weight: 80.4 kg (177 lb 4 oz)    History of present illness at the time of admission:  Holly Moyer is a 37 y.o. female with past medical history of iron deficiency anemia secondary to menstruation.   The patient is presenting with complaints of shortness of breath on exertion associated with chest pain that has been ongoing since last few weeks and progressively worsening. She mentions that the pain was severe today located in her center of the chest and radiating to her neck and arm and it felt like pressure and cramps. She denied any fever, chills, cough, nausea, vomiting, diarrhea, active bleeding, black color bowel movements, blood in urine, leg swelling, orthopnea, PND. She mentions that similar symptoms in the past but not to this extent.  She 13 years ago after her pregnancy she was prescribed iron supplements for iron deficiency anemia. She mentions that she continues to have heavy menstrual periods requiring pads changing every few hours initially.  In last 6 months she has not been taking iron supplements.    Hospital Course:   Chest Pain - Secondary to severe symptomatic anemia -Resolved with transfusion of two units of PRBCs. -Troponin was normal x 3 -EKG was abnormal, but no specific ischemia.  Anemia -Secondary to dysfunctional uterine bleeding from fibroids -Anemia panel pending at  the time of discharge. -Patient received 2 units of PRBCs, as well as IV iron. -Pelvic and transvaginal ultrasound completed.  Revealed multiple fibroids.  Results below. -Patient's GYN office was contacted, they will call patient for a quick follow up appointment.  Uterine Fibroids -as above-likely cause of anemia,secondary to menorrhagia  Discharge Exam: Filed Vitals:   02/20/13 0554  BP: 114/62  Pulse: 63  Temp: 98 F (36.7 C)  Resp: 18    General: A&O, NAD, lying in bed, very pleasant, good color. Cardiovascular: rrr, no m/r/g  No chest pain with palpation Respiratory: cta no w/c/r.   Abdomen:  Soft, nt, nd, +bs, no masses, rectal deferred by patient. Extremities:  5/5 strength in each    Discharge Instructions       Future Appointments Provider Department Dept Phone   05/14/2013 9:30 AM Verner Chol, CNM [facility] [phone]       Medication List    STOP taking these medications       aspirin EC 81 MG tablet     ibuprofen 200 MG tablet  Commonly known as:  ADVIL,MOTRIN      TAKE these medications       acetaminophen 500 MG tablet  Commonly known as:  TYLENOL  Take 1,000 mg by mouth every 6 (six) hours as needed for moderate pain.     DSS 100 MG Caps  Take 100 mg by mouth 2 (two) times daily. Take stool softeners while on oral iron.     ferrous sulfate 325 (65 FE) MG tablet  Take 1 tablet (325  mg total) by mouth 2 (two) times daily with a meal.     GARLIC PO  Take 1 tablet by mouth daily.       Allergies  Allergen Reactions  . Macrobid [Nitrofurantoin Macrocrystal] Itching   Follow-up Information   Follow up with [facility], CNM. Schedule an appointment as soon as possible for a visit in 1 week. Verner Chol office will call you with an appointment in the next 7 - 10 days.)    Specialty:  Certified Nurse Midwife   Contact information:   [address] [place] [address] [phone]         The results of significant diagnostics from this hospitalization (including imaging, microbiology, ancillary and laboratory) are listed below for reference.    Significant Diagnostic Studies: Dg Chest 2 View  02/19/2013   CLINICAL DATA:  Left chest pain.  Back pain.  EXAM: CHEST  2 VIEW  COMPARISON:  04/26/2009  FINDINGS: The heart size and mediastinal contours are within normal limits. Both lungs are clear. The visualized skeletal structures are unremarkable.  IMPRESSION: No active cardiopulmonary disease.   Electronically Signed   By: Herbie Baltimore M.D.   On: 02/19/2013 18:37    Transabdominal and Transvaginal Ultrasound: FINDINGS:  Uterus Measurements: 17.9 x 10.6 x 8.3 cm. Multiple fibroids are noted, predominantly intramural, some of which also produce mild displacement of the endometrial stripe. Dominant anterior uterine fundal intramural fibroid measures 6.9 x 6.6 x 5.7 cm.  Posterior uterine body intramural fibroid measures 6.2 x 6.0 x 5.2 cm.   Endometrium Thickness: 12 mm. Uniformly echogenic without focal abnormality, partly obscured by overlying fibroids.  Right ovary Measurements: 3.5 x 3.5 x 2.7 cm. Normal.  Left ovary Measurements: 4.1 x 3.4 x 2.3 cm. Normal.   Labs: Basic Metabolic Panel:  Recent Labs Lab 02/19/13 1738  NA 140  K 3.7  CL 105  CO2 25  GLUCOSE 88  BUN 12  CREATININE 0.99  CALCIUM 9.2   CBC:  Recent Labs Lab 02/19/13 1738 02/20/13 0516  WBC 8.2  --   HGB 5.7* 8.0*  HCT 20.3* 26.4*  MCV 58.3*  --   PLT 648*  --    Cardiac Enzymes:  Recent Labs Lab 02/20/13 0516 02/20/13 1130  TROPONINI <0.30  <0.30 <0.30   BNP: BNP (last 3 results)  Recent Labs  02/19/13 1738  PROBNP 125.8*   SignedConley Canal [phone] [facility] 02/20/2013, 12:56 PM  Attending patient seen and examined, agree with the above assessment and plan, admitted with shortness of breath and some mild chest pain, shortness  of breath was exclusively on exertion, found to be severely anemic. Suspect all admitting symptoms were secondary to severe anemia. She has a long-standing history of menorrhagia, pelvic and transvaginal ultrasound confirms uterine fibroids. This is likely the source for his severe anemia. She was transfused 2 units of PRBC, given IV iron, on discharge she will placed on oral iron supplementation, she will be discharged home in a stable condition today, her GYN office will call the patient for a quick followup appointment.  Windell Norfolk MD

## 2013-02-20 NOTE — Telephone Encounter (Signed)
Patient has been admitted to hospital on 02/19/13. Message left to return call to [place] at [phone].

## 2013-02-20 NOTE — Progress Notes (Signed)
Neysa Hotter to be D/C'd Home per MD order.  Discussed with the patient and all questions fully answered.    Medication List    STOP taking these medications       aspirin EC 81 MG tablet     ibuprofen 200 MG tablet  Commonly known as:  ADVIL,MOTRIN      TAKE these medications       acetaminophen 500 MG tablet  Commonly known as:  TYLENOL  Take 1,000 mg by mouth every 6 (six) hours as needed for moderate pain.     DSS 100 MG Caps  Take 100 mg by mouth 2 (two) times daily. Take stool softeners while on oral iron.     ferrous sulfate 325 (65 FE) MG tablet  Take 1 tablet (325 mg total) by mouth 2 (two) times daily with a meal.     GARLIC PO  Take 1 tablet by mouth daily.        VVS, Skin clean, dry and intact without evidence of skin break down, no evidence of skin tears noted. IV catheter discontinued intact. Site without signs and symptoms of complications. Dressing and pressure applied.  An After Visit Summary was printed and given to the patient. Patient escorted via WC, and D/C home via private auto.  Laurel Dimmer 02/20/2013 5:07 PM

## 2013-02-20 NOTE — Care Management Note (Signed)
    Page 1 of 1   02/20/2013     11:39:21 AM   CARE MANAGEMENT NOTE 02/20/2013  Patient:  Holly Moyer, Holly Moyer   Account Number:  [id number]  Date Initiated:  02/20/2013  Documentation initiated by:  Letha Cape  Subjective/Objective Assessment:   dx anemia  admit- lives with family. pta indep.     Action/Plan:   Anticipated DC Date:  02/20/2013   Anticipated DC Plan:  HOME/SELF CARE      DC Planning Services  CM consult      Choice offered to / List presented to:             Status of service:  Completed, signed off Medicare Important Message given?   (If response is "NO", the following Medicare IM given date fields will be blank) Date Medicare IM given:   Date Additional Medicare IM given:    Discharge Disposition:  HOME/SELF CARE  Per UR Regulation:  Reviewed for med. necessity/level of care/duration of stay  If discussed at Long Length of Stay Meetings, dates discussed:    Comments:

## 2013-02-20 NOTE — Telephone Encounter (Signed)
Pt is returning a call to Tracy °

## 2013-02-23 ENCOUNTER — Encounter: Payer: Self-pay | Admitting: Gynecology

## 2013-02-23 ENCOUNTER — Ambulatory Visit (INDEPENDENT_AMBULATORY_CARE_PROVIDER_SITE_OTHER): Payer: BC Managed Care – PPO | Admitting: Gynecology

## 2013-02-23 ENCOUNTER — Telehealth: Payer: Self-pay | Admitting: *Deleted

## 2013-02-23 ENCOUNTER — Other Ambulatory Visit: Payer: Self-pay | Admitting: *Deleted

## 2013-02-23 VITALS — BP 110/80 | HR 78 | Resp 18 | Ht 64.25 in | Wt 179.0 lb

## 2013-02-23 DIAGNOSIS — Z9189 Other specified personal risk factors, not elsewhere classified: Secondary | ICD-10-CM

## 2013-02-23 DIAGNOSIS — N920 Excessive and frequent menstruation with regular cycle: Secondary | ICD-10-CM

## 2013-02-23 DIAGNOSIS — D500 Iron deficiency anemia secondary to blood loss (chronic): Secondary | ICD-10-CM

## 2013-02-23 DIAGNOSIS — D259 Leiomyoma of uterus, unspecified: Secondary | ICD-10-CM

## 2013-02-23 DIAGNOSIS — Z9289 Personal history of other medical treatment: Secondary | ICD-10-CM

## 2013-02-23 LAB — TYPE AND SCREEN
ABO/RH(D): A POS
Antibody Screen: NEGATIVE
Unit division: 0

## 2013-02-23 LAB — CBC
HCT: 29.1 % — ABNORMAL LOW (ref 36.0–46.0)
Hemoglobin: 9 g/dL — ABNORMAL LOW (ref 12.0–15.0)
MCHC: 30.7 g/dL (ref 30.0–36.0)
MCV: 67.2 fL — ABNORMAL LOW (ref 78.0–100.0)
Platelets: 673 10*3/uL — ABNORMAL HIGH (ref 150–400)
RBC: 4.34 MIL/uL (ref 3.87–5.11)
WBC: 9.2 10*3/uL (ref 4.0–10.5)

## 2013-02-23 MED ORDER — NORETHINDRONE 0.35 MG PO TABS: 1.0000 | ORAL_TABLET | Freq: Every day | ORAL | Status: DC

## 2013-02-23 NOTE — Telephone Encounter (Signed)
Call to patient and advised we would like her to go to lab prior to appointment to have follow-up CBC done. Patient agreeable. Order already entered.  Routing to provider for final review. Patient agreeable to disposition. Will close encounter

## 2013-02-23 NOTE — Progress Notes (Signed)
Subjective:     Patient ID: Holly Moyer, female   DOB: 12-Jan-1976, 37 y.o.   MRN: [id number]  HPI Comments: Pt here for evaluation after being admitted to [facility] for chest pain and diagnosed with severe anemia.  Pt with known enlarged uterus but did not present for u/s or cbc in office.  Upon presentation to ER pt noted to have hb 5.7.  She was transfused 2u prbc's.  Pt reports that her cylces are regular.  LMP11/12/14.  Pt reports that she typically will wear a tampon and 2 pads simultaneously and change every 2h.  Pt denies clots but cannot tell due to heavy bleeding.  Pt is not sexually active at present. Pt was evaluated for shortness of breath and chest pain, both of which have resolved with transfusion.  She has since stopped bleeding.  Pt was started on iron, B12 and folic acid.    Review of Systems  Constitutional: Positive for fatigue. Negative for fever and chills.  Respiratory: Positive for chest tightness (resolved) and shortness of breath (resolved).   Cardiovascular: Positive for chest pain (resolved).  Gastrointestinal: Negative for abdominal pain.  Genitourinary: Positive for dysuria, urgency (evaluated by urology), vaginal bleeding, vaginal discharge (recurrent BV infections) and menstrual problem.  Hematological: Does not bruise/bleed easily.       Objective:   Physical Exam  Nursing note and vitals reviewed. Constitutional: She is oriented to person, place, and time. She appears well-developed and well-nourished.  Abdominal: Soft. She exhibits mass (uterus at umbilicus, suprpubic mass c/w anterior fibroid). There is no tenderness. There is no rebound and no guarding.  Genitourinary:  Normal external genitalia Vagina with scant blood, anterior fibroid deviating cervix posterior. Cervix without lesions Uterus enlarged to umbilicus, irregular, lateral fullness Adnexa not palpable   Neurological: She is alert and oriented to person, place, and time.  Skin: Skin is warm and  dry.   u/s: uterus 17.9 x 10.6 x 8.3 cm. Multiple fibroids are noted, predominantly intramural, some of which also produce mild displacement of the endometrial stripe. Dominant anterior uterine fundal intramural fibroid measures 6.9 x 6.6 x 5.7 cm. Posterior uterine body intramural fibroid measures 6.2 x 6.0 x 5.2 cm.  Hb 9.0/29.1,  11/20: 5.7/20.3     Assessment:     Menorrhagia  fibroid uterus  Anemia s/p transfusion    Plan:     Long discussion with pt regarding her options.  We discussed hysterectomy both abdominal and robotic.  We discussed at length the risks and benefits of each, including operative time, blood loss, recovery time.  Pt has had 2 c/s, has a large anterior fibroid including her risks.  We discussed the latest restrictions on morcellation.  We also discussed myomectomy, [place].  Questions addressed.  Pt would do better with an abdominal hysterectomy, and is agreeable to schedule. Regarding her menorrhagia and anemia, we will start her on micronor, understanding she is midcycle and may have break thru bleeding She will continue her iron, folic acid and B12 We will recheck her cbc in 4w

## 2013-03-03 ENCOUNTER — Telehealth: Payer: Self-pay | Admitting: Gynecology

## 2013-03-03 NOTE — Telephone Encounter (Signed)
LMTCB to discuss insurance benefits for surgery.

## 2013-03-04 NOTE — Telephone Encounter (Signed)
Spoke with patient in regards to her insurance benefits for surgery. Patient is agreeable and would like to request surgery to be scheduled the beginning of February.

## 2013-03-12 ENCOUNTER — Telehealth: Payer: Self-pay | Admitting: *Deleted

## 2013-03-12 NOTE — Telephone Encounter (Signed)
Call to patient and notified of surgery date of 05-13-13 at 0730 at [facility].  Patient had requested early February instead of January when spoke with Contractor.  Now thinks she may want to wait until march.  Advised that Dr lathrop wanted her to have surgery in January and that her HGB needs to be stable.  Patient agreeable to 05-13-13 date.  Surgery instructions reviewed and mailed to patient. Preop with CBC scheduled for 04-20-13 so it will be within 30 days of surgery.  This is a change from the end of December CBC that you ordered.  Is this acceptable?

## 2013-03-13 NOTE — Telephone Encounter (Signed)
yes

## 2013-03-16 ENCOUNTER — Telehealth: Payer: Self-pay | Admitting: Gynecology

## 2013-03-16 NOTE — Telephone Encounter (Signed)
Patient returned call. Appointment scheduled with Dr. Farrel Gobble for tomorrow at 0830.

## 2013-03-16 NOTE — Telephone Encounter (Signed)
Spoke with patient. She has been on Micronor since 11/24. Had what patient describes as heavy (but usual for her cycle) . She has been fatigued but is feeling improved. Still taking Iron and Micronor. Do you want her to come in for an office visit and CBC? She mentioned that you were considering placing her on something different to stop the bleeding.

## 2013-03-16 NOTE — Telephone Encounter (Signed)
Patient is having irregular menses. °

## 2013-03-17 ENCOUNTER — Ambulatory Visit: Payer: BC Managed Care – PPO | Admitting: Gynecology

## 2013-03-17 ENCOUNTER — Encounter: Payer: Self-pay | Admitting: Gynecology

## 2013-03-17 ENCOUNTER — Ambulatory Visit (INDEPENDENT_AMBULATORY_CARE_PROVIDER_SITE_OTHER): Payer: BC Managed Care – PPO | Admitting: Gynecology

## 2013-03-17 VITALS — BP 120/70 | HR 74 | Resp 12 | Ht 64.25 in | Wt 177.0 lb

## 2013-03-17 DIAGNOSIS — N920 Excessive and frequent menstruation with regular cycle: Secondary | ICD-10-CM

## 2013-03-17 DIAGNOSIS — D259 Leiomyoma of uterus, unspecified: Secondary | ICD-10-CM

## 2013-03-17 DIAGNOSIS — D500 Iron deficiency anemia secondary to blood loss (chronic): Secondary | ICD-10-CM

## 2013-03-17 MED ORDER — NORETHINDRONE ACETATE 5 MG PO TABS: 5.0000 mg | ORAL_TABLET | Freq: Every day | ORAL | Status: DC

## 2013-03-17 NOTE — Addendum Note (Signed)
Addended by: Clide Dales R on: 03/17/2013 10:03 AM   Modules accepted: Orders

## 2013-03-17 NOTE — Progress Notes (Signed)
Subjective:     Patient ID: Holly Moyer, female   DOB: [birth year], 37 y.o.   MRN: [id number]  HPI Comments: Pt here for f/u of menorrhagia.  Pt had been seen last month after transfusion of rbc.  Pt was started on micronor to control menses until she has her TAH.  Pt is scheduled for 05/13/2013.  Pt states that she had her cycle this month and it seemed to be unaffected by the ocp.  She is compliant in taking pills.  She has not been sexually active in years.  Pt has known fibroid uterus. She reports felling fatigued.  She is taking her iron and vitamins.  Pt states that she bled for 4d, but was intense to the point that she needed to stay in bed.    Review of Systems  Constitutional: Positive for fatigue. Negative for fever and chills.  Genitourinary: Positive for vaginal bleeding and menstrual problem. Negative for vaginal discharge, vaginal pain and pelvic pain.       Objective:   Physical Exam  Constitutional: She is oriented to person, place, and time. She appears well-developed and well-nourished.  Genitourinary: Vagina normal.  Normal external genitalia Vagina with scant blood Cervix sharply deivated to right Uterus irregular with anterior fibroid  Adnexa not palpable  Lymphadenopathy:       Right: No inguinal adenopathy present.       Left: No inguinal adenopathy present.  Neurological: She is alert and oriented to person, place, and time.       Assessment:     menorrhgia Fibroid uterus S/p transfusion     Plan:     Discussed changing to aygestin for control until or, pt had moved date for work. Recommend EMB today Repeat CBC today Pt consented to procedure:  Speculum placed, cervix cleaned with betadine, xylocaine jelly placed, stabilized with single tooth tenaculum.  pipelle advanced to 7cm, sample obtained and sent to path. Pt tolerated well  Will start aygestin today

## 2013-03-17 NOTE — Addendum Note (Signed)
Addended by: Clide Dales R on: 03/17/2013 09:56 AM   Modules accepted: Orders

## 2013-03-18 LAB — CBC
Hemoglobin: 9.9 g/dL — ABNORMAL LOW (ref 12.0–15.0)
MCH: 23.5 pg — ABNORMAL LOW (ref 26.0–34.0)
MCV: 75.3 fL — ABNORMAL LOW (ref 78.0–100.0)
RBC: 4.21 MIL/uL (ref 3.87–5.11)
WBC: 4.3 10*3/uL (ref 4.0–10.5)

## 2013-03-19 LAB — IPS CERVICAL/ECC/EMB/VULVAR/VAGINAL BIOPSY

## 2013-04-02 HISTORY — PX: ABDOMINAL HYSTERECTOMY: SHX81

## 2013-04-07 ENCOUNTER — Encounter: Payer: Self-pay | Admitting: Gynecology

## 2013-04-07 ENCOUNTER — Encounter: Payer: Self-pay | Admitting: Nurse Practitioner

## 2013-04-07 ENCOUNTER — Telehealth: Payer: Self-pay | Admitting: Gynecology

## 2013-04-07 NOTE — Telephone Encounter (Signed)
Patient called requesting mailed/written documentation of charges and patient liability for surgery to be rendered 02.11.2015. Advised patient that I would communicate with office administrator to request this document. Also reiterated the insurance quote per notes in EPIC.//ssf

## 2013-04-20 ENCOUNTER — Ambulatory Visit (INDEPENDENT_AMBULATORY_CARE_PROVIDER_SITE_OTHER): Payer: BC Managed Care – PPO | Admitting: Gynecology

## 2013-04-20 ENCOUNTER — Encounter: Payer: Self-pay | Admitting: Gynecology

## 2013-04-20 VITALS — BP 124/86 | HR 80 | Resp 18 | Ht 64.25 in | Wt 180.0 lb

## 2013-04-20 DIAGNOSIS — N920 Excessive and frequent menstruation with regular cycle: Secondary | ICD-10-CM

## 2013-04-20 DIAGNOSIS — D500 Iron deficiency anemia secondary to blood loss (chronic): Secondary | ICD-10-CM

## 2013-04-20 DIAGNOSIS — D259 Leiomyoma of uterus, unspecified: Secondary | ICD-10-CM

## 2013-04-20 MED ORDER — NORETHINDRONE ACETATE 5 MG PO TABS: 5.0000 mg | ORAL_TABLET | Freq: Two times a day (BID) | ORAL | Status: DC

## 2013-04-20 MED ORDER — OXYCODONE-ACETAMINOPHEN 5-325 MG PO TABS: 2.0000 | ORAL_TABLET | ORAL | Status: DC | PRN

## 2013-04-20 NOTE — Consult Note (Signed)
38 y.o.Single african [place] G3P2 female presents for preoperative consult for total abdominal hysterectomy with bilateral salpingectomy for abnormal uterine bleeding and fibroids menorrhagia and anemia .pt was transfused and is now taking iron.  Pt was started on aygestin to maintain her until OR, she has been taking it twice a day after she had some break through bleeding. She is without complaints and is ready for surgery.  PMHx:  Fibroid  Anemia  HTN  SurgHx: c/s x2 All: macrobid  Constitutional: negative for chills, fatigue, fevers and malaise Genitourinary:negative for vaginal discharge Hematologic/lymphatic: negative BP 124/86  Pulse 80  Resp 18  Ht 5' 4.25" (1.632 m)  Wt 180 lb (81.647 kg)  BMI 30.65 kg/m2 General appearance: alert, cooperative and appears stated age Head: Normocephalic, without obvious abnormality, atraumatic Lungs: clear to auscultation bilaterally Heart: regular rate and rhythm, S1, S2 normal, no murmur, click, rub or gallop Abdomen: normal findings: bowel sounds normal and uteurs palpable and mobile 2 fingerbreadth below umbilicus Pelvic: cervix normal in appearance, external genitalia normal, no cervical motion tenderness, vagina normal without discharge and uterus irregular, mobile, adnexa not palpable Extremities: extremities normal, atraumatic, no cyanosis or edema Skin: Skin color, texture, turgor normal. No rashes or lesions   The procedure was discussed at length, including the expected post-operative course. The ACOG handoutwas not given to the patientprior to the consult.  Pt informed regarding risks and benefits of surgery, including but not limited to bleeding, infections, damage to bowel, bladder. Pt is aware that 2x c/s will increase risk of bladder injury and fibroids will increase utereral injury.   Patient was made aware that damage that is seen immediately would be addressed at the time and may require additional surgical consults.  In  addition some damage is delayed and may require return to the operating room in the future.    Risk of blood transfusion from discussed, including the risk of infection from receiving blood was discussed.  It was recommended that the patient does not donate autologously.  The patientis accept blood products if deemed necessary.  There is a risk of formation of a deep vein thrombus in the extremities was discussed, although PAS will be placed to minimize the risk, the patient was informed that a pulmonary embolism could still form and could result in death.  Early ambulation was stressed.  She was instructed on signs and symptoms to be aware of and the need to call if they should develop.  Routine post-operative milestone was discussed.   All questions were addressed.  Post-operative medications Percocet were given to the patient. She was instructed to use stool softeners while on pain medications

## 2013-04-20 NOTE — Patient Instructions (Signed)
Use stool softeners such as colace while taking percocet Simethicone as needed Transition to NSAIDS when able No driving while on narcotics No tub baths

## 2013-04-27 ENCOUNTER — Telehealth: Payer: Self-pay | Admitting: Gynecology

## 2013-04-27 NOTE — Telephone Encounter (Signed)
Patient was calling about the papers she brought last Monday for her surgery. She has to turn them in tomorrow at work. i advised her that they took two weeks to be completed and we would call her as soon as they were finished. Said she as never told that when she dropped them off. Wanted to talk to sally.

## 2013-04-27 NOTE — Telephone Encounter (Signed)
LMTCB. Forms completed by dr Charlies Constable. LMTCB

## 2013-04-27 NOTE — Telephone Encounter (Signed)
Called patient to let her know the forms were ready mailbox is full. Unable to leave message.

## 2013-04-30 NOTE — Telephone Encounter (Signed)
Forms appear to have been picked up since no longer in file at front desk.  Routing to provider for final review. Patient agreeable to disposition. Will close encounter

## 2013-05-01 ENCOUNTER — Encounter (HOSPITAL_COMMUNITY): Payer: Self-pay | Admitting: Pharmacist

## 2013-05-11 ENCOUNTER — Encounter (HOSPITAL_COMMUNITY)
Admission: RE | Admit: 2013-05-11 | Discharge: 2013-05-11 | Disposition: A | Discharge status: Home / Self Care | Payer: BC Managed Care – PPO | Source: Ambulatory Visit | Attending: Gynecology | Admitting: Gynecology

## 2013-05-11 ENCOUNTER — Encounter (HOSPITAL_COMMUNITY): Payer: Self-pay

## 2013-05-11 LAB — PROTIME-INR
INR: 0.91 (ref 0.00–1.49)
PROTHROMBIN TIME: 12.1 s (ref 11.6–15.2)

## 2013-05-11 LAB — CBC
HCT: 37.4 % (ref 36.0–46.0)
Hemoglobin: 12.4 g/dL (ref 12.0–15.0)
MCH: 27.7 pg (ref 26.0–34.0)
MCHC: 33.2 g/dL (ref 30.0–36.0)
MCV: 83.5 fL (ref 78.0–100.0)
PLATELETS: 351 10*3/uL (ref 150–400)
RBC: 4.48 MIL/uL (ref 3.87–5.11)
RDW: 17.8 % — ABNORMAL HIGH (ref 11.5–15.5)
WBC: 5 10*3/uL (ref 4.0–10.5)

## 2013-05-11 NOTE — Patient Instructions (Signed)
Willard  05/11/2013   Your procedure is scheduled on:  05/13/13  Enter through the Main Entrance of [facility] up the phone at the desk and dial [phone].   Call this number if you have problems the morning of surgery: [phone]   Remember:   Do not eat food:After Midnight.  Do not drink clear liquids: After Midnight.  Take these medicines the morning of surgery with A SIP OF WATER: NA   Do not wear jewelry, make-up or nail polish.  Do not wear lotions, powders, or perfumes. You may wear deodorant.  Do not shave 48 hours prior to surgery.  Do not bring valuables to the hospital.  [facility] is not   responsible for any belongings or valuables brought to the hospital.  Contacts, dentures or bridgework may not be worn into surgery.  Leave suitcase in the car. After surgery it may be brought to your room.  For patients admitted to the hospital, checkout time is 11:00 AM the day of              discharge.   Patients discharged the day of surgery will not be allowed to drive             home.  Name and phone number of your driver: NA  Special Instructions:      Please read over the following fact sheets that you were given:   Surgical Site Infection Prevention

## 2013-05-12 MED ORDER — DEXTROSE 5 % IV SOLN
2.0000 g | INTRAVENOUS | Status: DC
  Filled 2013-05-12: qty 2

## 2013-05-13 ENCOUNTER — Inpatient Hospital Stay (HOSPITAL_COMMUNITY)
Admission: RE | Admit: 2013-05-13 | Discharge: 2013-05-15 | DRG: 743 | Disposition: A | Discharge status: Home / Self Care | Payer: BC Managed Care – PPO | Source: Ambulatory Visit | Attending: Gynecology | Admitting: Gynecology

## 2013-05-13 ENCOUNTER — Inpatient Hospital Stay (HOSPITAL_COMMUNITY): Payer: BC Managed Care – PPO | Admitting: Anesthesiology

## 2013-05-13 ENCOUNTER — Encounter (HOSPITAL_COMMUNITY)
Admission: RE | Disposition: A | Discharge status: Home / Self Care | Payer: Self-pay | Source: Ambulatory Visit | Attending: Gynecology

## 2013-05-13 ENCOUNTER — Encounter (HOSPITAL_COMMUNITY): Payer: Self-pay | Admitting: *Deleted

## 2013-05-13 ENCOUNTER — Encounter (HOSPITAL_COMMUNITY): Payer: BC Managed Care – PPO | Admitting: Anesthesiology

## 2013-05-13 DIAGNOSIS — Z90710 Acquired absence of both cervix and uterus: Secondary | ICD-10-CM

## 2013-05-13 DIAGNOSIS — N920 Excessive and frequent menstruation with regular cycle: Secondary | ICD-10-CM

## 2013-05-13 DIAGNOSIS — D259 Leiomyoma of uterus, unspecified: Secondary | ICD-10-CM

## 2013-05-13 DIAGNOSIS — D649 Anemia, unspecified: Secondary | ICD-10-CM | POA: Diagnosis present

## 2013-05-13 DIAGNOSIS — N938 Other specified abnormal uterine and vaginal bleeding: Secondary | ICD-10-CM | POA: Diagnosis present

## 2013-05-13 DIAGNOSIS — D251 Intramural leiomyoma of uterus: Secondary | ICD-10-CM | POA: Diagnosis present

## 2013-05-13 DIAGNOSIS — D252 Subserosal leiomyoma of uterus: Secondary | ICD-10-CM | POA: Diagnosis present

## 2013-05-13 DIAGNOSIS — N949 Unspecified condition associated with female genital organs and menstrual cycle: Secondary | ICD-10-CM | POA: Diagnosis present

## 2013-05-13 DIAGNOSIS — D250 Submucous leiomyoma of uterus: Secondary | ICD-10-CM | POA: Diagnosis present

## 2013-05-13 DIAGNOSIS — I10 Essential (primary) hypertension: Secondary | ICD-10-CM | POA: Diagnosis present

## 2013-05-13 HISTORY — PX: BILATERAL SALPINGECTOMY: SHX5743

## 2013-05-13 HISTORY — PX: SUPRACERVICAL ABDOMINAL HYSTERECTOMY: SHX5393

## 2013-05-13 HISTORY — PX: CYSTO: SHX6284

## 2013-05-13 LAB — PREGNANCY, URINE: PREG TEST UR: NEGATIVE

## 2013-05-13 LAB — TYPE AND SCREEN
ABO/RH(D): A POS
Antibody Screen: NEGATIVE

## 2013-05-13 LAB — ABO/RH: ABO/RH(D): A POS

## 2013-05-13 SURGERY — SALPINGECTOMY, BILATERAL, OPEN
Anesthesia: General | Site: Bladder

## 2013-05-13 MED ORDER — DEXAMETHASONE SODIUM PHOSPHATE 10 MG/ML IJ SOLN
INTRAMUSCULAR | Status: AC
  Filled 2013-05-13: qty 1

## 2013-05-13 MED ORDER — FENTANYL CITRATE 0.05 MG/ML IJ SOLN
INTRAMUSCULAR | Status: DC | PRN
  Administered 2013-05-13: 50 ug via INTRAVENOUS
  Administered 2013-05-13: 100 ug via INTRAVENOUS
  Administered 2013-05-13: 50 ug via INTRAVENOUS
  Administered 2013-05-13: 25 ug via INTRAVENOUS
  Administered 2013-05-13: 100 ug via INTRAVENOUS
  Administered 2013-05-13: 25 ug via INTRAVENOUS
  Administered 2013-05-13: 100 ug via INTRAVENOUS
  Administered 2013-05-13: 50 ug via INTRAVENOUS

## 2013-05-13 MED ORDER — DOCUSATE SODIUM 100 MG PO CAPS
100.0000 mg | ORAL_CAPSULE | Freq: Two times a day (BID) | ORAL | Status: DC
  Administered 2013-05-13 – 2013-05-15 (×4): 100 mg via ORAL
  Filled 2013-05-13 (×4): qty 1

## 2013-05-13 MED ORDER — KETOROLAC TROMETHAMINE 30 MG/ML IJ SOLN
INTRAMUSCULAR | Status: AC
  Filled 2013-05-13: qty 1

## 2013-05-13 MED ORDER — ONDANSETRON HCL 4 MG/2ML IJ SOLN
INTRAMUSCULAR | Status: AC
  Filled 2013-05-13: qty 2

## 2013-05-13 MED ORDER — HYDROMORPHONE HCL PF 1 MG/ML IJ SOLN
0.2500 mg | INTRAMUSCULAR | Status: DC | PRN
  Administered 2013-05-13 (×5): 0.5 mg via INTRAVENOUS

## 2013-05-13 MED ORDER — HYDROMORPHONE HCL PF 1 MG/ML IJ SOLN
INTRAMUSCULAR | Status: AC
  Administered 2013-05-13: 0.5 mg via INTRAVENOUS
  Filled 2013-05-13: qty 1

## 2013-05-13 MED ORDER — LACTATED RINGERS IV SOLN: INTRAVENOUS | Status: DC

## 2013-05-13 MED ORDER — MENTHOL 3 MG MT LOZG
1.0000 | LOZENGE | OROMUCOSAL | Status: DC | PRN
  Administered 2013-05-13: 3 mg via ORAL
  Filled 2013-05-13: qty 9

## 2013-05-13 MED ORDER — GLYCOPYRROLATE 0.2 MG/ML IJ SOLN
INTRAMUSCULAR | Status: DC | PRN
  Administered 2013-05-13: 0.6 mg via INTRAVENOUS

## 2013-05-13 MED ORDER — LACTATED RINGERS IV SOLN
INTRAVENOUS | Status: DC
  Administered 2013-05-13 (×2): via INTRAVENOUS

## 2013-05-13 MED ORDER — MIDAZOLAM HCL 2 MG/2ML IJ SOLN
INTRAMUSCULAR | Status: DC | PRN
  Administered 2013-05-13: 2 mg via INTRAVENOUS

## 2013-05-13 MED ORDER — GLYCOPYRROLATE 0.2 MG/ML IJ SOLN
INTRAMUSCULAR | Status: AC
  Filled 2013-05-13: qty 3

## 2013-05-13 MED ORDER — STERILE WATER FOR IRRIGATION IR SOLN
Status: DC | PRN
  Administered 2013-05-13: 1000 mL

## 2013-05-13 MED ORDER — OXYCODONE-ACETAMINOPHEN 5-325 MG PO TABS
1.0000 | ORAL_TABLET | ORAL | Status: DC | PRN
  Administered 2013-05-13: 1 via ORAL
  Administered 2013-05-14: 2 via ORAL
  Administered 2013-05-14 (×2): 1 via ORAL
  Administered 2013-05-14 – 2013-05-15 (×4): 2 via ORAL
  Filled 2013-05-13 (×2): qty 2
  Filled 2013-05-13 (×2): qty 1
  Filled 2013-05-13 (×3): qty 2
  Filled 2013-05-13: qty 1

## 2013-05-13 MED ORDER — ROCURONIUM BROMIDE 100 MG/10ML IV SOLN
INTRAVENOUS | Status: AC
  Filled 2013-05-13: qty 1

## 2013-05-13 MED ORDER — ONDANSETRON HCL 4 MG PO TABS: 4.0000 mg | ORAL_TABLET | Freq: Four times a day (QID) | ORAL | Status: DC | PRN

## 2013-05-13 MED ORDER — HYDROMORPHONE HCL PF 1 MG/ML IJ SOLN
0.2000 mg | INTRAMUSCULAR | Status: DC | PRN
  Administered 2013-05-13: 0.6 mg via INTRAVENOUS
  Administered 2013-05-13: 0.2 mg via INTRAVENOUS
  Administered 2013-05-13 (×2): 0.6 mg via INTRAVENOUS
  Filled 2013-05-13 (×4): qty 1

## 2013-05-13 MED ORDER — PROPOFOL 10 MG/ML IV EMUL
INTRAVENOUS | Status: AC
  Filled 2013-05-13: qty 20

## 2013-05-13 MED ORDER — FENTANYL CITRATE 0.05 MG/ML IJ SOLN
INTRAMUSCULAR | Status: AC
  Filled 2013-05-13: qty 5

## 2013-05-13 MED ORDER — KETOROLAC TROMETHAMINE 30 MG/ML IJ SOLN
INTRAMUSCULAR | Status: DC | PRN
  Administered 2013-05-13: 30 mg via INTRAVENOUS

## 2013-05-13 MED ORDER — KETOROLAC TROMETHAMINE 30 MG/ML IJ SOLN: 30.0000 mg | Freq: Once | INTRAMUSCULAR | Status: DC

## 2013-05-13 MED ORDER — LACTATED RINGERS IV SOLN
INTRAVENOUS | Status: DC
  Administered 2013-05-13 (×5): via INTRAVENOUS

## 2013-05-13 MED ORDER — DEXAMETHASONE SODIUM PHOSPHATE 10 MG/ML IJ SOLN
INTRAMUSCULAR | Status: DC | PRN
  Administered 2013-05-13: 10 mg via INTRAVENOUS

## 2013-05-13 MED ORDER — SIMETHICONE 80 MG PO CHEW
160.0000 mg | CHEWABLE_TABLET | Freq: Four times a day (QID) | ORAL | Status: DC
  Administered 2013-05-13 – 2013-05-15 (×6): 160 mg via ORAL
  Filled 2013-05-13 (×8): qty 2

## 2013-05-13 MED ORDER — ROCURONIUM BROMIDE 100 MG/10ML IV SOLN
INTRAVENOUS | Status: DC | PRN
  Administered 2013-05-13: 40 mg via INTRAVENOUS
  Administered 2013-05-13: 10 mg via INTRAVENOUS
  Administered 2013-05-13: 5 mg via INTRAVENOUS
  Administered 2013-05-13 (×2): 10 mg via INTRAVENOUS

## 2013-05-13 MED ORDER — NEOSTIGMINE METHYLSULFATE 1 MG/ML IJ SOLN
INTRAMUSCULAR | Status: DC | PRN
  Administered 2013-05-13: 3 mg via INTRAVENOUS

## 2013-05-13 MED ORDER — ZOLPIDEM TARTRATE 5 MG PO TABS: 5.0000 mg | ORAL_TABLET | Freq: Every evening | ORAL | Status: DC | PRN

## 2013-05-13 MED ORDER — LIDOCAINE HCL (CARDIAC) 20 MG/ML IV SOLN
INTRAVENOUS | Status: DC | PRN
  Administered 2013-05-13: 50 mg via INTRAVENOUS

## 2013-05-13 MED ORDER — ONDANSETRON HCL 4 MG/2ML IJ SOLN
4.0000 mg | Freq: Four times a day (QID) | INTRAMUSCULAR | Status: DC | PRN
  Administered 2013-05-13: 4 mg via INTRAVENOUS
  Filled 2013-05-13: qty 2

## 2013-05-13 MED ORDER — LIDOCAINE HCL (CARDIAC) 20 MG/ML IV SOLN
INTRAVENOUS | Status: AC
  Filled 2013-05-13: qty 5

## 2013-05-13 MED ORDER — ONDANSETRON HCL 4 MG/2ML IJ SOLN
INTRAMUSCULAR | Status: DC | PRN
  Administered 2013-05-13: 4 mg via INTRAVENOUS

## 2013-05-13 MED ORDER — PROPOFOL 10 MG/ML IV BOLUS
INTRAVENOUS | Status: DC | PRN
  Administered 2013-05-13: 180 mg via INTRAVENOUS

## 2013-05-13 MED ORDER — METHYLENE BLUE 1 % INJ SOLN
INTRAMUSCULAR | Status: AC
  Filled 2013-05-13: qty 10

## 2013-05-13 MED ORDER — MIDAZOLAM HCL 2 MG/2ML IJ SOLN
INTRAMUSCULAR | Status: AC
  Filled 2013-05-13: qty 2

## 2013-05-13 MED ORDER — NEOSTIGMINE METHYLSULFATE 1 MG/ML IJ SOLN
INTRAMUSCULAR | Status: AC
  Filled 2013-05-13: qty 1

## 2013-05-13 MED ORDER — DEXTROSE 5 % IV SOLN
2.0000 g | INTRAVENOUS | Status: AC
  Administered 2013-05-13: 2 g via INTRAVENOUS
  Filled 2013-05-13: qty 2

## 2013-05-13 SURGICAL SUPPLY — 36 items
BENZOIN TINCTURE PRP APPL 2/3 (GAUZE/BANDAGES/DRESSINGS) ×6 IMPLANT
CANISTER SUCT 3000ML (MISCELLANEOUS) ×12 IMPLANT
CHLORAPREP W/TINT 26ML (MISCELLANEOUS) ×6 IMPLANT
CLOSURE WOUND 1/2 X4 (GAUZE/BANDAGES/DRESSINGS) ×1
DRAPE WARM FLUID 44X44 (DRAPE) ×6 IMPLANT
DRSG OPSITE POSTOP 4X10 (GAUZE/BANDAGES/DRESSINGS) ×6 IMPLANT
GAUZE SPONGE 4X4 16PLY XRAY LF (GAUZE/BANDAGES/DRESSINGS) ×12 IMPLANT
GLOVE BIOGEL M 6.5 STRL (GLOVE) ×36 IMPLANT
GLOVE BIOGEL PI IND STRL 6.5 (GLOVE) ×16 IMPLANT
GLOVE BIOGEL PI IND STRL 7.0 (GLOVE) ×36 IMPLANT
GLOVE BIOGEL PI INDICATOR 6.5 (GLOVE) ×8
GLOVE BIOGEL PI INDICATOR 7.0 (GLOVE) ×18
GLOVE ECLIPSE 7.0 STRL STRAW (GLOVE) ×18 IMPLANT
GLOVE SURG SS PI 7.0 STRL IVOR (GLOVE) ×12 IMPLANT
GOWN STRL REUS W/TWL LRG LVL3 (GOWN DISPOSABLE) ×24 IMPLANT
NEEDLE HYPO 25X1 1.5 SAFETY (NEEDLE) ×6 IMPLANT
NS IRRIG 1000ML POUR BTL (IV SOLUTION) ×6 IMPLANT
PACK ABDOMINAL GYN (CUSTOM PROCEDURE TRAY) ×6 IMPLANT
PAD ABD 7.5X8 STRL (GAUZE/BANDAGES/DRESSINGS) ×6 IMPLANT
PAD OB MATERNITY 4.3X12.25 (PERSONAL CARE ITEMS) ×6 IMPLANT
PROTECTOR NERVE ULNAR (MISCELLANEOUS) ×18 IMPLANT
SPONGE LAP 18X18 X RAY DECT (DISPOSABLE) ×24 IMPLANT
STRIP CLOSURE SKIN 1/2X4 (GAUZE/BANDAGES/DRESSINGS) ×5 IMPLANT
SUT PLAIN 2 0 XLH (SUTURE) ×6 IMPLANT
SUT VIC AB 0 CT1 18XCR BRD8 (SUTURE) ×12 IMPLANT
SUT VIC AB 0 CT1 27 (SUTURE) ×10
SUT VIC AB 0 CT1 27XBRD ANBCTR (SUTURE) ×20 IMPLANT
SUT VIC AB 0 CT1 8-18 (SUTURE) ×6
SUT VIC AB 1 CT1 36 (SUTURE) ×6 IMPLANT
SUT VIC AB 2-0 CT1 27 (SUTURE) ×4
SUT VIC AB 2-0 CT1 TAPERPNT 27 (SUTURE) ×8 IMPLANT
SUT VIC AB 3-0 SH 27 (SUTURE) ×2
SUT VIC AB 3-0 SH 27XBRD (SUTURE) ×4 IMPLANT
SYR CONTROL 10ML LL (SYRINGE) ×12 IMPLANT
TAPE CLOTH SURG 4X10 WHT LF (GAUZE/BANDAGES/DRESSINGS) ×6 IMPLANT
TOWEL OR 17X24 6PK STRL BLUE (TOWEL DISPOSABLE) ×18 IMPLANT

## 2013-05-13 NOTE — H&P (View-Only) (Signed)
37 y.o.Single african american G3P2 female presents for preoperative consult for total abdominal hysterectomy with bilateral salpingectomy for abnormal uterine bleeding and fibroids menorrhagia and anemia .pt was transfused and is now taking iron.  Pt was started on aygestin to maintain her until OR, she has been taking it twice a day after she had some break through bleeding. She is without complaints and is ready for surgery.  PMHx:  Fibroid  Anemia  HTN  SurgHx: c/s x2 All: macrobid  Constitutional: negative for chills, fatigue, fevers and malaise Genitourinary:negative for vaginal discharge Hematologic/lymphatic: negative BP 124/86  Pulse 80  Resp 18  Ht 5' 4.25" (1.632 m)  Wt 180 lb (81.647 kg)  BMI 30.65 kg/m2 General appearance: alert, cooperative and appears stated age Head: Normocephalic, without obvious abnormality, atraumatic Lungs: clear to auscultation bilaterally Heart: regular rate and rhythm, S1, S2 normal, no murmur, click, rub or gallop Abdomen: normal findings: bowel sounds normal and uteurs palpable and mobile 2 fingerbreadth below umbilicus Pelvic: cervix normal in appearance, external genitalia normal, no cervical motion tenderness, vagina normal without discharge and uterus irregular, mobile, adnexa not palpable Extremities: extremities normal, atraumatic, no cyanosis or edema Skin: Skin color, texture, turgor normal. No rashes or lesions   The procedure was discussed at length, including the expected post-operative course. The ACOG handoutwas not given to the patientprior to the consult.  Pt informed regarding risks and benefits of surgery, including but not limited to bleeding, infections, damage to bowel, bladder. Pt is aware that 2x c/s will increase risk of bladder injury and fibroids will increase utereral injury.   Patient was made aware that damage that is seen immediately would be addressed at the time and may require additional surgical consults.  In  addition some damage is delayed and may require return to the operating room in the future.    Risk of blood transfusion from discussed, including the risk of infection from receiving blood was discussed.  It was recommended that the patient does not donate autologously.  The patientis accept blood products if deemed necessary.  There is a risk of formation of a deep vein thrombus in the extremities was discussed, although PAS will be placed to minimize the risk, the patient was informed that a pulmonary embolism could still form and could result in death.  Early ambulation was stressed.  She was instructed on signs and symptoms to be aware of and the need to call if they should develop.  Routine post-operative milestone was discussed.   All questions were addressed.  Post-operative medications Percocet were given to the patient. She was instructed to use stool softeners while on pain medications       

## 2013-05-13 NOTE — Anesthesia Postprocedure Evaluation (Signed)
  Anesthesia Post-op Note  Patient: Holly Moyer  Procedure(s) Performed: Procedure(s): BILATERAL SALPINGECTOMY (Bilateral) HYSTERECTOMY SUPRACERVICAL ABDOMINAL (N/A) CYSTO (N/A)  Patient Location: [facility]  Anesthesia Type:General  Level of Consciousness: awake, alert , oriented and patient cooperative  Airway and Oxygen Therapy: Patient Spontanous Breathing  Post-op Pain: mild  Post-op Assessment: Post-op Vital signs reviewed, Patient's Cardiovascular Status Stable, Respiratory Function Stable, Patent Airway, No signs of Nausea or vomiting, Adequate PO intake and Pain level controlled  Post-op Vital Signs: Reviewed and stable  Complications: No apparent anesthesia complications

## 2013-05-13 NOTE — Transfer of Care (Signed)
Immediate Anesthesia Transfer of Care Note  Patient: Holly Moyer  Procedure(s) Performed: Procedure(s): HYSTERECTOMY ABDOMINAL (N/A) BILATERAL SALPINGECTOMY (Bilateral)  Patient Location: PACU  Anesthesia Type:General  Level of Consciousness: awake  Airway & Oxygen Therapy: Patient Spontanous Breathing  Post-op Assessment: Report given to PACU RN  Post vital signs: stable  Filed Vitals:   05/13/13 0714  BP: 138/90  Pulse: 73  Temp: 37 C  Resp: 18    Complications: No apparent anesthesia complications

## 2013-05-13 NOTE — Anesthesia Preprocedure Evaluation (Signed)
Anesthesia Evaluation  Patient identified by MRN, date of birth, ID band Patient awake    Reviewed: Allergy & Precautions, H&P , Patient's Chart, lab work & pertinent test results, reviewed documented beta blocker date and time   Airway Mallampati: II TM Distance: >3 FB Neck ROM: full    Dental no notable dental hx.    Pulmonary  breath sounds clear to auscultation  Pulmonary exam normal       Cardiovascular hypertension, Rhythm:regular Rate:Normal     Neuro/Psych    GI/Hepatic   Endo/Other    Renal/GU      Musculoskeletal   Abdominal   Peds  Hematology   Anesthesia Other Findings   Reproductive/Obstetrics                           Anesthesia Physical Anesthesia Plan  ASA: II  Anesthesia Plan: General   Post-op Pain Management:    Induction: Intravenous  Airway Management Planned: Oral ETT  Additional Equipment:   Intra-op Plan:   Post-operative Plan: Extubation in OR  Informed Consent: I have reviewed the patients History and Physical, chart, labs and discussed the procedure including the risks, benefits and alternatives for the proposed anesthesia with the patient or authorized representative who has indicated his/her understanding and acceptance.   Dental Advisory Given and Dental advisory given  Plan Discussed with: CRNA and Surgeon  Anesthesia Plan Comments: (  Discussed general anesthesia, including possible nausea, instrumentation of airway, sore throat,pulmonary aspiration, etc. I asked if the were any outstanding questions, or  concerns before we proceeded. )        Anesthesia Quick Evaluation  

## 2013-05-13 NOTE — Anesthesia Postprocedure Evaluation (Signed)
  Anesthesia Post Note  Patient: Holly Moyer  Procedure(s) Performed: Procedure(s) (LRB): BILATERAL SALPINGECTOMY (Bilateral) HYSTERECTOMY SUPRACERVICAL ABDOMINAL (N/A) CYSTO (N/A)  Anesthesia type: GA  Patient location: PACU  Post pain: Pain level controlled  Post assessment: Post-op Vital signs reviewed  Last Vitals:  Filed Vitals:   05/13/13 1330  BP: 144/92  Pulse: 77  Temp: 36.9 C  Resp: 16    Post vital signs: Reviewed  Level of consciousness: sedated  Complications: No apparent anesthesia complications

## 2013-05-13 NOTE — Brief Op Note (Signed)
05/13/2013  11:55 AM  PATIENT:  Holly Moyer  38 y.o. female  PRE-OPERATIVE DIAGNOSIS:  Fibroids, menorrhagia  POST-OPERATIVE DIAGNOSIS:  Fibroids, menorrhagia  PROCEDURE:  Procedure(s): HYSTERECTOMY ABDOMINAL (N/A) BILATERAL SALPINGECTOMY (Bilateral)  SURGEON:  Surgeon(s) and Role:    * Azalia Bilis, MD - Primary    * Brook E Amundson de Berton Lan, MD - Assisting  PHYSICIAN ASSISTANT:   ASSISTANTS: none   ANESTHESIA:   general  EBL:  Total I/O In: 3500 [I.V.:3500] Out: 750 [Urine:600; Blood:150]  BLOOD ADMINISTERED:none  DRAINS: Urinary Catheter (Foley)   LOCAL MEDICATIONS USED:  NONE  SPECIMEN:  Source of Specimen:  uterus and tubes  DISPOSITION OF SPECIMEN:  PATHOLOGY  COUNTS:  YES  TOURNIQUET:  * No tourniquets in log *  DICTATION: .Other Dictation: Dictation Number [id number]  PLAN OF CARE: Admit to inpatient   PATIENT DISPOSITION:  PACU - hemodynamically stable.   Delay start of Pharmacological VTE agent (>24hrs) due to surgical blood loss or risk of bleeding: yes

## 2013-05-13 NOTE — Addendum Note (Signed)
Addendum created 05/13/13 1732 by Brock Ra, CRNA   Modules edited: Notes Section   Notes Section:  File: [id number]

## 2013-05-13 NOTE — Progress Notes (Signed)
Day of Surgery Procedure(s) (LRB): BILATERAL SALPINGECTOMY (Bilateral) HYSTERECTOMY SUPRACERVICAL ABDOMINAL (N/A) CYSTO (N/A)  Subjective: Patient reports pain well controled, now reports a 2.  Tolerating po.   Objective: I have reviewed patient's vital signs, intake and output and medications.  General: alert, cooperative and appears stated age GI: soft nontender, dressing dry intact Extremities: extremities normal, atraumatic, no cyanosis or edema Vaginal Bleeding: none  Assessment: s/p Procedure(s): BILATERAL SALPINGECTOMY (Bilateral) HYSTERECTOMY SUPRACERVICAL ABDOMINAL (N/A) CYSTO (N/A): stable  Plan: Advance diet Operative findings reviewed. Questions addressed  LOS: 0 days    Holly Moyer H 05/13/2013, 5:51 PM

## 2013-05-13 NOTE — Interval H&P Note (Signed)
History and Physical Interval Note:  05/13/2013 8:06 AM  Holly Moyer  has presented today for surgery, with the diagnosis of fibroids  The various methods of treatment have been discussed with the patient and family. After consideration of risks, benefits and other options for treatment, the patient has consented to  Procedure(s): HYSTERECTOMY ABDOMINAL (N/A) BILATERAL SALPINGECTOMY (Bilateral) as a surgical intervention .  The patient's history has been reviewed, patient examined, no change in status, stable for surgery.  I have reviewed the patient's chart and labs.  Questions were answered to the patient's satisfaction.     Kahle Mcqueen H

## 2013-05-14 ENCOUNTER — Encounter (HOSPITAL_COMMUNITY): Payer: Self-pay | Admitting: Gynecology

## 2013-05-14 ENCOUNTER — Encounter: Payer: Self-pay | Admitting: Certified Nurse Midwife

## 2013-05-14 ENCOUNTER — Ambulatory Visit: Payer: Self-pay | Admitting: Certified Nurse Midwife

## 2013-05-14 LAB — CBC
HCT: 34 % — ABNORMAL LOW (ref 36.0–46.0)
HEMOGLOBIN: 11.2 g/dL — AB (ref 12.0–15.0)
MCH: 27.4 pg (ref 26.0–34.0)
MCHC: 32.9 g/dL (ref 30.0–36.0)
MCV: 83.1 fL (ref 78.0–100.0)
PLATELETS: 327 10*3/uL (ref 150–400)
RBC: 4.09 MIL/uL (ref 3.87–5.11)
RDW: 16.7 % — ABNORMAL HIGH (ref 11.5–15.5)
WBC: 12.6 10*3/uL — ABNORMAL HIGH (ref 4.0–10.5)

## 2013-05-14 NOTE — Op Note (Signed)
NAMESHAILEY, Holly Moyer              ACCOUNT NO.:  [id number]  MEDICAL RECORD NO.:  [id number]  LOCATION:  9319                          FACILITY:  [facility]  PHYSICIAN:  Alver Sorrow. Charlies Constable, M.D. DATE OF BIRTH:  12/24/1975  DATE OF PROCEDURE: DATE OF DISCHARGE:                              OPERATIVE REPORT   PREOPERATIVE DIAGNOSES: 1. Multifibroid uterus. 2. Additional diagnosis, menorrhagia.  ADDITIONAL DIAGNOSIS: 1. Multifibroid uterus. 2. Additional diagnosis, menorrhagia.  POSTOPERATIVE DIAGNOSIS: 1. Multifibroid uterus. 2. Additional diagnosis, menorrhagia.  PROCEDURE: 1. supracervical abdominal hysterectomy. 2. bilateral salpingectomy. 3. Cystoscopy  SURGEON:  Alver Sorrow. Charlies Constable, MD  ASSISTANTQuincy Simmonds.  ANESTHESIA:  General.  IV FLUIDS:  3500 mL lactated Ringer's.  URINE OUTPUT:  600 mL of clear urine.  ESTIMATED BLOOD LOSS:  150 mL.  INDICATIONS:  The patient is a 38 year old, G2, P2, with multifibroid uterus and severe menorrhagia that has required transfusion due to severe anemia.  The patient has been stabilized on Aygestin until surgery and now presents for elective procedure.  FINDINGS:  There is a markedly enlarged and distorted fibroid uterus, weighing 1036g Tubes and ovaries were normal in appearance.  There was marked amount of scarring of the bladder to the anterior uterus.  The liver edge was smooth.  Inspection of the appendix was normal.  The upper abdomen palpated is normal.  DESCRIPTION OF PROCEDURE:  The patient was taken to the operating room. General anesthesia was induced and placed in dorsal lithotomy position, prepped and draped in usual sterile fashion.  A Foley had been placed. The bimanual exam was performed.  The uterus was noted to be mobile. The patient had 2 prior C-sections and the scars were visualized, however felt to be, 1 and 2 fingerbreadths respectively above the pubic symphysis which was not adequate for surgery today.  A  Pfannenstiel skin incision was then made with the scalpel and carried through to the underlying layer of fascia with electrocautery with careful attention to transecting blood vessels.  The fascia was then cleared off, scored in the midline and the incision was extended laterally.  The rectus muscles were grasped on inferior end and sharply dissected off the underlying muscle with careful attention to possible protrusions of the bladder. Similarly, the superior aspect of the incision was grasped with Kocher, underlying rectus muscles were dissected off sharply.  The rectus muscles were then identified and tented up, and gently scored with the scalpel until the peritoneum was visualized.  The peritoneum was then tented up, and nicked with the scalpel and examining finger was placed in the incision and was noted to be free of underlying bowel.  The incision was then extended superiorly and inferiorly and the fascia was similarly dissected further up superiorly.  As we got closer to the inferior aspect, there was marked scarring of the muscle and bladder and using transillumination and careful attention, we were able to take the peritoneal incision down free of the bladder.  The pelvis was inspected. The uterus was noted to be mobile.  We were able to deliver the uterus through the incision.  The tubes and ovaries were visualized and noted to be unremarkable.  The round ligaments were  noted to be attenuated, they were grasped with a Babcock, suture ligated with 0 Vicryl.  A clamp was placed for back bleeding and then transected, this was done bilaterally.  The posterior leaf of the broad ligament was similarly dissected down towards the ovary.  The peristalsis of the ureter was able to be identified on the left hand side.  The tube was elevated and sharply dissected off the ovary.  The suture ligature of 0 Vicryl was placed for hemostasis purposes and dissection carried through until  the utero-ovarian ligament was reached, this was similarly clamped, transected, and suture ligated.  The ovary on the left hand side was then able to be dissected off the uterus after the posterior peritoneum was incised and packed away.  In a similar fashion, the right ovary was dissected free of the right tube and suture ligatures of 0 Vicryl were placed.  The utero-ovarian ligament was transected and suture ligated in a similar fashion, and the ovary was packed.  A single lap pack was then placed in the incision to prevent both the ovaries and the bowel from falling into the cul-de-sac which was noted to be deep.  Attention was then turned anteriorly, attempt at the bladder flap was created from the round ligament, however the scarring was noted to be quite extensive. The peritoneum was tented up, and then staying as close to the uterus as possible and in the midline, we slowly and meticulously worked on taking the bladder down from the anterior uterus.  At one point, we stopped the procedure.  The Foley was changed to a triple-lumen and methaline blue dyed fluid was then placed in the Foley.  The bladder deflection was seen, it was noted to be close to where we were and keeping a small amount of fluid in the bladder aided in helping define the proper plane.  The vesicouterine peritoneum was never well identified and we slowly and sharply took down the bladder anteriorly.  Once we felt that we had the bladder down sufficiently, the uterine vessels were then skeletonized and 2 Haney clamps were placed and this was done bilaterally, suture ligature with 0 Vicryl was then placed and a second suture was placed for reassurance, again this was done bilaterally.  We were noted to not be far from the edge of the bladder and continued to work on the bladder.  The Foley had been placed back on drainage.  With the slow progress and the lack of good planes on the bladder, we stopped at this point,  the uterus was sharply dissected off the underlying cervix.  Now that the uterus was removed, the length of the cervix was better able to be appreciated and appeared to be 4-5 cm further in length from where we had taken the bladder down. Because of the considerable scarring that was noted, I elected instead to do a supracervical hysterectomy so as to not cause trauma or rent in the bladder.  The incision was then packed with warm moist lap pads and a cystoscopy was performed at this point.  The legs were elevated.  The Foley was removed.  The cystoscope was then advanced through the urethra into the bladder.  The bladder was unremarkable, peristalsis from both ureters was able to be visualized. The dome of the bladder and trigone were also inspected and unremarkable.  The bubble was clearly visualized.  The cystoscope was slowly removed.  Urethra similarly was unremarkable.  The single-lumen catheter was then placed back in  the Foley.  Attention was then turned back to the abdomen after gloves were changed.  As the bladder was sufficiently scarred but was apparently intact at this point, we confirmed that we were going to not proceed any further.  The posterior peritoneum was plicated to the cervix and cervical cuff was closed in interrupted figures-of-eight prior to which we had attempted to ball cautery the endocervix but without success and elected to terminate. The pelvis was then irrigated with a copious amount of warm saline. There was some small amount of bleeding noted from the left ovary which was treated with 2-0 Vicryl.  Reinspection of the pelvis assured [place] of hemostasis, small areas of bleeding were treated were appropriate with cautery.  The sponges were then all removed.  Inspection of the upper abdomen and appendix then ensued.  The peritoneum was then closed with 0 Vicryl in a running fashion with the bowel protected with a malleable. The muscles were reapproximated  loosely with interrupted 0 Vicryl.  The fascia was closed starting at the apex with 0 Vicryl bilaterally.  The subcu was reapproximated loosely with 3-0 plain.  The skin was closed with 4-0 Vicryl on the [place].  Steri-Strips were placed.  The patient tolerated the procedure well.  Sponge, lap, needle counts were correct x2.  She received cefotetan intraoperatively, extubated in the OR and transferred to the recovery room in stable condition.     Alver Sorrow. Charlies Constable, M.D.     THL/MEDQ  D:  05/13/2013  T:  05/14/2013  Job:  [id number]

## 2013-05-14 NOTE — Progress Notes (Signed)
1 Day Post-Op Procedure(s) (LRB): BILATERAL SALPINGECTOMY (Bilateral) HYSTERECTOMY SUPRACERVICAL ABDOMINAL (N/A) CYSTO (N/A)  Subjective: Patient reports tolerating PO and no problems voiding.  Ambulating without difficulty  Objective: I have reviewed patient's vital signs, intake and output, medications and labs.  General: alert, cooperative and appears stated age Resp: clear to auscultation bilaterally Cardio: regular rate and rhythm, S1, S2 normal, no murmur, click, rub or gallop GI: soft, non-tender; bowel sounds normal; no masses,  no organomegaly Extremities: extremities normal, atraumatic, no cyanosis or edema Vaginal Bleeding: none Incision: clean, dry, intact, with steris  Assessment: s/p Procedure(s): BILATERAL SALPINGECTOMY (Bilateral) HYSTERECTOMY SUPRACERVICAL ABDOMINAL (N/A) CYSTO (N/A): stable and progressing well  Plan: Encourage ambulation Discontinue IV fluids  LOS: 1 day    Corine Solorio H 05/14/2013, 8:32 AM

## 2013-05-15 ENCOUNTER — Telehealth: Payer: Self-pay | Admitting: Gynecology

## 2013-05-15 NOTE — Progress Notes (Signed)
Pt out in wheelchair  Teaching complete   

## 2013-05-15 NOTE — Telephone Encounter (Signed)
Return call to [place], not available LM with [facility] confirming surgery date of 05-13-13.  Routing to provider for final review. Patient agreeable to disposition. Will close encounter

## 2013-05-15 NOTE — Telephone Encounter (Signed)
Shawn from [facility] is calling to confirm surgery dates for this patient.  Claim #: [id number]

## 2013-05-15 NOTE — Progress Notes (Signed)
2 Days Post-Op Procedure(s) (LRB): BILATERAL SALPINGECTOMY (Bilateral) HYSTERECTOMY SUPRACERVICAL ABDOMINAL (N/A) CYSTO (N/A)  Subjective: Patient reports tolerating PO, + flatus and no problems voiding.  No bowel movement.  Ambulating without issues, no vaginal bleeding  Objective: I have reviewed patient's vital signs, intake and output, medications, labs and pathology.  General: alert, cooperative and appears stated age Resp: clear to auscultation bilaterally Cardio: regular rate and rhythm, S1, S2 normal, no murmur, click, rub or gallop GI: soft, non-tender; bowel sounds normal; no masses,  no organomegaly Extremities: extremities normal, atraumatic, no cyanosis or edema Vaginal Bleeding: none Incision: clean, dry, intact  Assessment: s/p Procedure(s): BILATERAL SALPINGECTOMY (Bilateral) HYSTERECTOMY SUPRACERVICAL ABDOMINAL (N/A) CYSTO (N/A): stable and progressing well  Plan: Discharge home Pathology reviewed with patient, benign.  LOS: 2 days    Kaimani Clayson H 05/15/2013, 10:59 AM

## 2013-05-20 ENCOUNTER — Encounter: Payer: Self-pay | Admitting: Gynecology

## 2013-05-20 ENCOUNTER — Ambulatory Visit (INDEPENDENT_AMBULATORY_CARE_PROVIDER_SITE_OTHER): Payer: BC Managed Care – PPO | Admitting: Gynecology

## 2013-05-20 VITALS — BP 150/89 | HR 69 | Resp 14 | Ht 64.0 in | Wt 179.0 lb

## 2013-05-20 DIAGNOSIS — R300 Dysuria: Secondary | ICD-10-CM

## 2013-05-20 LAB — POCT URINALYSIS DIPSTICK
Blood, UA: 2
Urobilinogen, UA: NEGATIVE
pH, UA: 5

## 2013-05-20 NOTE — Progress Notes (Signed)
Subjective:     Patient ID: Holly Moyer, female   DOB: 1975-09-27, 38 y.o.   MRN: [id number]  HPI Comments: Pt here 1w post-op after supercervical hysterectomy.  Pt reports regular bowel movements, using motrin only for pain.  She reports pain with voiding that radiates bilaterally, no blood, no vaginal bleeding    Review of Systems  Constitutional: Negative for fever, chills and fatigue.  Gastrointestinal: Negative for nausea, vomiting, abdominal pain, diarrhea and abdominal distention.  Genitourinary: Positive for dysuria. Negative for urgency, frequency, flank pain, vaginal bleeding and difficulty urinating.       Objective:   Physical Exam  Nursing note and vitals reviewed. Constitutional: She appears well-developed and well-nourished.  Abdominal: Soft. Bowel sounds are normal. She exhibits no distension and no mass. There is tenderness (nonspecific). There is no guarding.  Periumbilical ecchymosis-blue  Incision: with steris intact, no discharge, eccymosis     Assessment:     Post-op 1w doing well     Plan:     No driving until 2w post-op Pathology reviewed earlier   F/u 5w

## 2013-05-21 LAB — URINALYSIS, MICROSCOPIC ONLY
Bacteria, UA: NONE SEEN
CASTS: NONE SEEN
Crystals: NONE SEEN

## 2013-05-29 NOTE — Discharge Summary (Signed)
Physician Discharge Summary  Patient ID: Holly Moyer MRN: [id number] DOB/AGE: 1975-07-09 38 y.o.  Admit date: 05/13/2013 Discharge date: 05/29/2013  Admission Diagnoses: fibroid uterus  Discharge Diagnoses:  Active Problems:   S/P hysterectomy   Discharged Condition: stable  Hospital Course: admitted for elective abdominal hysterectomy for fibroids.  No complications, reached normal postoperative mild stones appropriately.  Vitals and labs stable throughout.  Pathology benign fibroid uterus. Discharged home, POD #2, tolerating regular diet, pain controled with oral analgesics  Consults: None  Significant Diagnostic Studies: CBC stable  Treatments: surgery: partial hysterectomy, bilateral salpingectomy  Discharge Exam: Blood pressure 123/72, pulse 69, temperature 97.9 F (36.6 C), temperature source Oral, resp. rate 18, height 5\' 4"  (1.626 m), weight 180 lb (81.647 kg), last menstrual period 05/09/2013, SpO2 98.00%.  Days Post-Op Procedure(s) (LRB):  BILATERAL SALPINGECTOMY (Bilateral)  HYSTERECTOMY SUPRACERVICAL ABDOMINAL (N/A)  CYSTO (N/A)  Subjective:  Patient reports tolerating PO, + flatus and no problems voiding. No bowel movement. Ambulating without issues, no vaginal bleeding  Objective:  I have reviewed patient's vital signs, intake and output, medications, labs and pathology.  General: alert, cooperative and appears stated age  Resp: clear to auscultation bilaterally  Cardio: regular rate and rhythm, S1, S2 normal, no murmur, click, rub or gallop  GI: soft, non-tender; bowel sounds normal; no masses, no organomegaly  Extremities: extremities normal, atraumatic, no cyanosis or edema  Vaginal Bleeding: none  Incision: clean, dry, intact  Assessment:  s/p Procedure(s):  BILATERAL SALPINGECTOMY (Bilateral)  HYSTERECTOMY SUPRACERVICAL ABDOMINAL (N/A)  CYSTO (N/A): stable and progressing well  Plan:  Discharge home  Pathology reviewed with patient, benign.  LOS:  2 days      Disposition: 01-Home or Self Care  Discharge Orders   Future Appointments Provider Department Dept Phone   06/22/2013 12:00 PM Azalia Bilis, MD [facility] [phone]   Future Orders Complete By Expires   Diet general  As directed    Discharge instructions  As directed    Comments:     Use simethicone 180mg  4x/d, colace 3x/d, glycerine suppository if needed   Discharge wound care:  As directed    Comments:     May remove steri strips if they begin to fall off   Driving Restrictions  As directed    Comments:     Until cleared   Increase activity slowly  As directed    Lifting restrictions  As directed    Comments:     not greater than 15#   May shower / Bathe  As directed    Sexual Activity Restrictions  As directed    Comments:     For 6w       Medication List    STOP taking these medications       A-21 PO     folic acid 308 MCG tablet  Commonly known as:  FOLVITE     norethindrone 5 MG tablet  Commonly known as:  AYGESTIN      TAKE these medications       ferrous sulfate 325 (65 FE) MG tablet  Take 1 tablet (325 mg total) by mouth 2 (two) times daily with a meal.         Signed: Tonette Koehne H 05/29/2013, 12:14 PM

## 2013-06-18 ENCOUNTER — Telehealth: Payer: Self-pay | Admitting: Emergency Medicine

## 2013-06-18 ENCOUNTER — Ambulatory Visit (INDEPENDENT_AMBULATORY_CARE_PROVIDER_SITE_OTHER): Payer: BC Managed Care – PPO | Admitting: Obstetrics and Gynecology

## 2013-06-18 ENCOUNTER — Encounter: Payer: Self-pay | Admitting: Obstetrics and Gynecology

## 2013-06-18 VITALS — BP 120/72 | HR 80 | Ht 64.0 in | Wt 181.5 lb

## 2013-06-18 DIAGNOSIS — IMO0002 Reserved for concepts with insufficient information to code with codable children: Secondary | ICD-10-CM

## 2013-06-18 DIAGNOSIS — IMO0001 Reserved for inherently not codable concepts without codable children: Secondary | ICD-10-CM

## 2013-06-18 NOTE — Telephone Encounter (Signed)
Patient calls, reports that right side of abdominal incision is open slightly and draining.  Started yesterday and seems to be increasing.  Draining red/pinkish fluid. Requests to see Dr Charlies Constable to have this checked. Dr lathrop out of office, advised can see Dr Quincy Simmonds for incision check and return to Dr Charlies Constable for post op. Patient agreeable since wants incision checked.  Appt 1000 today.  Surgery date 05-13-13. Supracervical abdominal hysterectomy with bilateral salpingectomy.  Routing to provider for final review. Patient agreeable to disposition. Will close encounter  Dr Charlies Constable, just [facility].

## 2013-06-18 NOTE — Progress Notes (Signed)
Patient ID: Holly Moyer, female   DOB: 22-Jan-1976, 38 y.o.   MRN: [id number] GYNECOLOGY VISIT  PCP:   Charleston Poot, MD  Referring provider:   HPI: 38 y.o.   Single  African American  female   [phone] with Patient's last menstrual period was 05/09/2013.   here for  Incision check--had supracervical hysterectomy/bilateral salpingectomy for uterine fibroids 5 weeks ago by Dr. Charlies Constable. Noting bleeding from right side of incision yesterday.  Has bled the amount of a dime.   Possible yellowish drainage.   Some soreness in general.   No fevers, shakes or chills.  A little nausea the other day, but this resolved. Normal bowel movements, last time was last yesterday.  Voiding well.  No dysuria.  Took ibuprofen for headache, but otherwise no pain.   Being careful about lifting. No sudden increase in activity.  Driving.    GYNECOLOGIC HISTORY: Patient's last menstrual period was 05/09/2013. Sexually active: not currently  Partner preference: female Contraception:  hysterectomy  Menopausal hormone therapy: n/a DES exposure:   no Blood transfusions:  Several due to anemia(last transfusion 01/2013)  Sexually transmitted diseases:  no  GYN procedures and prior surgeries: Supracervical hysterectomy/BSO 05-13-13--Dr. Charlies Constable, colposcopy, C-section x2  Last mammogram:  2014 due to lump in right breast:[facility]               Last pap and high risk HPV testing: 05-09-12 wnl:neg HR HPV History of abnormal pap smear: 2004 colposcopy and treatment of HPV    OB History   Grav Para Term Preterm Abortions TAB SAB Ect Mult Living   3 3  1      2        LIFESTYLE: Exercise:   no            Tobacco: no Alcohol:no Drug use:  no  Patient Active Problem List   Diagnosis Date Noted  . S/P hysterectomy 05/13/2013  . Menorrhagia 02/19/2013  . Chest pain 02/19/2013  . DOE (dyspnea on exertion) 02/19/2013  . FIBROIDS, UTERUS 04/26/2009  . MICROSCOPIC HEMATURIA 02/10/2009  . ACUTE  CYSTITIS 12/31/2008  . BREAST MASS, RIGHT 06/30/2008  . OTHER ABSCESS OF VULVA 05/13/2008  . BRUISE 05/13/2008  . ANEMIA 01/16/2008    Past Medical History  Diagnosis Date  . Anemia   . Breast mass in female 2010    questionable mass, no f/u (fibroadenoma vs papilloma)  . Hypertension   . Headache(784.0)   . Pneumonia     2008    Past Surgical History  Procedure Laterality Date  . Colposcopy  2004  . Wisdom tooth extraction    . Cesarean section      x's 2  . Bilateral salpingectomy Bilateral 05/13/2013    Procedure: BILATERAL SALPINGECTOMY;  Surgeon: Azalia Bilis, MD;  Location: [facility];  Service: Gynecology;  Laterality: Bilateral;  . Supracervical abdominal hysterectomy N/A 05/13/2013    Procedure: HYSTERECTOMY SUPRACERVICAL ABDOMINAL;  Surgeon: Azalia Bilis, MD;  Location: [facility];  Service: Gynecology;  Laterality: N/A;  . Cysto N/A 05/13/2013    Procedure: Kathrene Alu;  Surgeon: Azalia Bilis, MD;  Location: [facility];  Service: Gynecology;  Laterality: N/A;    Current Outpatient Prescriptions  Medication Sig Dispense Refill  . fexofenadine (ALLEGRA) 180 MG tablet Take 180 mg by mouth daily.      Marland Kitchen ibuprofen (ADVIL,MOTRIN) 400 MG tablet Take 400 mg by mouth every 6 (six) hours as needed.      . Multiple  Vitamin (MULTIVITAMIN) capsule Take 1 capsule by mouth daily.       No current facility-administered medications for this visit.     ALLERGIES: Macrobid  Family History  Problem Relation Age of Onset  . Hypertension Father   . Heart disease Father     40's  . Diabetes Maternal Grandmother   . Heart failure Maternal Grandmother   . Hypertension Paternal Grandmother   . Hypertension Paternal Grandfather     History   Social History  . Marital Status: Single    Spouse Name: N/A    Number of Children: N/A  . Years of Education: N/A   Occupational History  . Not on file.   Social History Main Topics  . Smoking status: Never Smoker   . Smokeless tobacco:  Not on file  . Alcohol Use: No  . Drug Use: No  . Sexual Activity: Not Currently    Partners: Male    Birth Control/ Protection: Surgical     Comment: supracervical hysterectomy/BSO   Other Topics Concern  . Not on file   Social History Narrative  . No narrative on file    ROS:  Pertinent items are noted in HPI.  PHYSICAL EXAMINATION:    BP 120/72  Pulse 80  Ht 5\' 4"  (1.626 m)  Wt 181 lb 8 oz (82.328 kg)  BMI 31.14 kg/m2  LMP 05/09/2013   Wt Readings from Last 3 Encounters:  06/18/13 181 lb 8 oz (82.328 kg)  05/20/13 179 lb (81.194 kg)  05/13/13 180 lb (81.647 kg)     Ht Readings from Last 3 Encounters:  06/18/13 5\' 4"  (1.626 m)  05/20/13 5\' 4"  (1.626 m)  05/13/13 5\' 4"  (1.626 m)    General appearance: alert, cooperative and appears stated age Abdomen: normal bowel sounds, soft, non-tender; no masses,  no organomegaly. Incision - right apex with vicryl knot protruding from skin and skin edges separated 3 mm area. Knot clipped with scissors. Bandaid placed.    ASSESSMENT  Minor drainage from incision where vicryl knot located. I expect drainage will now stop and final edges close.  PLAN  Cleanse with antibacterial soap and water.  Return for final post op visit with Dr. Charlies Constable next week.   An After Visit Summary was printed and given to the patient.

## 2013-06-18 NOTE — Patient Instructions (Signed)
Please clean the incision with antibacterial soap and water.  You may keep the area covered with a bandaid to avoid drainage on your clothing until the skin edges finish their healing.

## 2013-06-22 ENCOUNTER — Encounter: Payer: Self-pay | Admitting: Gynecology

## 2013-06-22 ENCOUNTER — Ambulatory Visit (INDEPENDENT_AMBULATORY_CARE_PROVIDER_SITE_OTHER): Payer: BC Managed Care – PPO | Admitting: Gynecology

## 2013-06-22 VITALS — BP 140/90 | HR 80 | Resp 16 | Ht 64.0 in | Wt 182.0 lb

## 2013-06-22 DIAGNOSIS — N920 Excessive and frequent menstruation with regular cycle: Secondary | ICD-10-CM

## 2013-06-22 DIAGNOSIS — D259 Leiomyoma of uterus, unspecified: Secondary | ICD-10-CM

## 2013-06-22 NOTE — Progress Notes (Signed)
Subjective:     Patient ID: Holly Moyer, female   DOB: 03-Jun-1975, 38 y.o.   MRN: [id number]  HPI Comments: Pt here for post-op visit after supracervical hysterectomy for fibroids and menorrhagia.  Pt reports overall doing well, she is to return to work on Wednesday of this week and feels ready.  Pt is driving, tolerating regular diet, is having regular bowel and bladder function.  Pt does report some light spotting last week that lasted for 3d.  Pt was seen here last week for a small spot of bleeding from the inicsion angle at the suture site, none since it was removed.    Review of Systems  Constitutional: Negative for fever, chills and fatigue.  Genitourinary: Negative for pelvic pain.       Objective:   Physical Exam  Constitutional: She is oriented to person, place, and time. She appears well-developed and well-nourished.  Abdominal: Soft. She exhibits no distension and no mass. There is no tenderness. There is no rebound and no guarding.  Genitourinary: Vagina normal. There is no lesion on the right labia. There is no lesion on the left labia. Cervix exhibits no motion tenderness, no discharge and no friability. Right adnexum displays no mass and no tenderness. Left adnexum displays no mass and no tenderness. No vaginal discharge found.  Lymphadenopathy:       Right: No inguinal adenopathy present.       Left: No inguinal adenopathy present.  Neurological: She is alert and oriented to person, place, and time.  Skin: Skin is warm and dry.  incision: intact,     Assessment:     Post-op supracervical hysterectomy doing well Cervical bleeding Overdue for annual exam     Plan:     Pt released for work without restrictions Explained that pt may have cyclic bleeding from her cervix, will watch and readdress at annual

## 2013-06-29 ENCOUNTER — Ambulatory Visit: Payer: BC Managed Care – PPO | Admitting: Gynecology

## 2013-07-11 ENCOUNTER — Ambulatory Visit (INDEPENDENT_AMBULATORY_CARE_PROVIDER_SITE_OTHER): Payer: BC Managed Care – PPO | Admitting: Family Medicine

## 2013-07-11 ENCOUNTER — Ambulatory Visit: Payer: BC Managed Care – PPO

## 2013-07-11 DIAGNOSIS — M542 Cervicalgia: Secondary | ICD-10-CM

## 2013-07-11 DIAGNOSIS — IMO0002 Reserved for concepts with insufficient information to code with codable children: Secondary | ICD-10-CM

## 2013-07-11 DIAGNOSIS — S161XXA Strain of muscle, fascia and tendon at neck level, initial encounter: Secondary | ICD-10-CM

## 2013-07-11 DIAGNOSIS — S39012A Strain of muscle, fascia and tendon of lower back, initial encounter: Secondary | ICD-10-CM

## 2013-07-11 DIAGNOSIS — S139XXA Sprain of joints and ligaments of unspecified parts of neck, initial encounter: Secondary | ICD-10-CM

## 2013-07-11 DIAGNOSIS — M549 Dorsalgia, unspecified: Secondary | ICD-10-CM

## 2013-07-11 MED ORDER — CYCLOBENZAPRINE HCL 10 MG PO TABS: ORAL_TABLET | ORAL | Status: DC

## 2013-07-11 MED ORDER — HYDROCODONE-ACETAMINOPHEN 5-325 MG PO TABS: 1.0000 | ORAL_TABLET | Freq: Four times a day (QID) | ORAL | Status: DC | PRN

## 2013-07-11 MED ORDER — NAPROXEN 500 MG PO TABS: 500.0000 mg | ORAL_TABLET | Freq: Two times a day (BID) | ORAL | Status: DC
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
## 2013-07-11 NOTE — Patient Instructions (Addendum)
Take the muscle relaxants one in the morning, one in the afternoon, and 2 at bedtime as tolerated. It may cause some drowsiness in the daytime.  Take naproxen 500 mg one twice daily  Take the hydrocodone pain pills only if needed for severe pain  After a few days begin doing some gentle stretches.  Consider using some ice and heat alternating on the neck and back.

## 2013-08-31 ENCOUNTER — Encounter: Payer: Self-pay | Admitting: Gynecology

## 2013-09-30 ENCOUNTER — Ambulatory Visit: Payer: BC Managed Care – PPO | Admitting: Gynecology

## 2013-10-23 ENCOUNTER — Ambulatory Visit: Payer: BC Managed Care – PPO | Admitting: Gynecology

## 2013-10-23 ENCOUNTER — Encounter: Payer: Self-pay | Admitting: Gynecology

## 2013-11-19 LAB — LIPID PANEL
CHOLESTEROL: 197 mg/dL (ref 0–200)
HDL: 51 mg/dL (ref 35–70)
LDL CALC: 130 mg/dL
TRIGLYCERIDES: 108 mg/dL (ref 40–160)

## 2013-11-19 LAB — BASIC METABOLIC PANEL
Creatinine: 1 mg/dL (ref 0.5–1.1)
GLUCOSE: 91 mg/dL
Potassium: 4.3 mmol/L (ref 3.4–5.3)
SODIUM: 138 mmol/L (ref 137–147)

## 2013-11-19 LAB — CBC AND DIFFERENTIAL
HCT: 40 % (ref 36–46)
HEMOGLOBIN: 13.5 g/dL (ref 12.0–16.0)
Platelets: 206 10*3/uL (ref 150–399)
WBC: 6 10^3/mL

## 2013-11-19 LAB — HEPATIC FUNCTION PANEL
ALK PHOS: 52 U/L (ref 25–125)
ALT: 11 U/L (ref 7–35)
AST: 14 U/L (ref 13–35)
BILIRUBIN, TOTAL: 1.3 mg/dL

## 2013-11-19 LAB — HEMOGLOBIN A1C: Hemoglobin A1C: 5.5

## 2013-12-03 ENCOUNTER — Telehealth: Payer: Self-pay | Admitting: Internal Medicine

## 2013-12-03 NOTE — Telephone Encounter (Signed)
Closed encounter °

## 2014-01-01 ENCOUNTER — Ambulatory Visit: Payer: BC Managed Care – PPO | Admitting: Internal Medicine

## 2014-01-28 ENCOUNTER — Ambulatory Visit: Payer: BC Managed Care – PPO | Admitting: Internal Medicine

## 2014-05-14 ENCOUNTER — Emergency Department (HOSPITAL_COMMUNITY)
Admission: EM | Admit: 2014-05-14 | Discharge: 2014-05-15 | Discharge status: Against Medical Advice | Payer: BLUE CROSS/BLUE SHIELD | Attending: Emergency Medicine | Admitting: Emergency Medicine

## 2014-05-14 ENCOUNTER — Emergency Department (HOSPITAL_COMMUNITY): Payer: BLUE CROSS/BLUE SHIELD

## 2014-05-14 ENCOUNTER — Encounter (HOSPITAL_COMMUNITY): Payer: Self-pay | Admitting: Emergency Medicine

## 2014-05-14 DIAGNOSIS — R0602 Shortness of breath: Secondary | ICD-10-CM | POA: Insufficient documentation

## 2014-05-14 DIAGNOSIS — I10 Essential (primary) hypertension: Secondary | ICD-10-CM | POA: Diagnosis not present

## 2014-05-14 DIAGNOSIS — M79602 Pain in left arm: Secondary | ICD-10-CM | POA: Diagnosis not present

## 2014-05-14 DIAGNOSIS — R42 Dizziness and giddiness: Secondary | ICD-10-CM | POA: Insufficient documentation

## 2014-05-14 DIAGNOSIS — R51 Headache: Secondary | ICD-10-CM | POA: Diagnosis not present

## 2014-05-14 DIAGNOSIS — Z7982 Long term (current) use of aspirin: Secondary | ICD-10-CM | POA: Diagnosis not present

## 2014-05-14 DIAGNOSIS — R079 Chest pain, unspecified: Secondary | ICD-10-CM | POA: Insufficient documentation

## 2014-05-14 DIAGNOSIS — Z79899 Other long term (current) drug therapy: Secondary | ICD-10-CM | POA: Diagnosis not present

## 2014-05-14 DIAGNOSIS — R220 Localized swelling, mass and lump, head: Secondary | ICD-10-CM | POA: Insufficient documentation

## 2014-05-14 DIAGNOSIS — Z8701 Personal history of pneumonia (recurrent): Secondary | ICD-10-CM | POA: Diagnosis not present

## 2014-05-14 DIAGNOSIS — D649 Anemia, unspecified: Secondary | ICD-10-CM | POA: Insufficient documentation

## 2014-05-14 LAB — CBC
HCT: 40.1 % (ref 36.0–46.0)
Hemoglobin: 13.6 g/dL (ref 12.0–15.0)
MCH: 29.1 pg (ref 26.0–34.0)
MCHC: 33.9 g/dL (ref 30.0–36.0)
MCV: 85.7 fL (ref 78.0–100.0)
PLATELETS: 250 10*3/uL (ref 150–400)
RBC: 4.68 MIL/uL (ref 3.87–5.11)
RDW: 13.4 % (ref 11.5–15.5)
WBC: 7.4 10*3/uL (ref 4.0–10.5)

## 2014-05-14 LAB — BASIC METABOLIC PANEL
Anion gap: 7 (ref 5–15)
BUN: 14 mg/dL (ref 6–23)
CHLORIDE: 104 mmol/L (ref 96–112)
CO2: 27 mmol/L (ref 19–32)
Calcium: 9.7 mg/dL (ref 8.4–10.5)
Creatinine, Ser: 1.02 mg/dL (ref 0.50–1.10)
GFR calc Af Amer: 80 mL/min — ABNORMAL LOW (ref >90)
GFR calc non Af Amer: 69 mL/min — ABNORMAL LOW (ref >90)
GLUCOSE: 100 mg/dL — AB (ref 70–99)
Potassium: 4.1 mmol/L (ref 3.5–5.1)
Sodium: 138 mmol/L (ref 135–145)

## 2014-05-14 LAB — BRAIN NATRIURETIC PEPTIDE: B NATRIURETIC PEPTIDE 5: 7.5 pg/mL (ref 0.0–100.0)

## 2014-05-14 LAB — I-STAT TROPONIN, ED: Troponin i, poc: 0 ng/mL (ref 0.00–0.08)

## 2014-05-14 NOTE — ED Provider Notes (Signed)
CSN: [id number]     Arrival date & time 05/14/14  2102 History   None    This chart was scribed for Holly Balls, MD by Forrestine Him, ED Scribe. This patient was seen in room D34C/D34C and the patient's care was started 11:35 PM.   Chief Complaint  Patient presents with  . Chest Pain   HPI  HPI Comments: Holly Moyer is a 39 y.o. female with a PMHx of HTN who presents to the Emergency Department complaining of constant, moderate L sided chest pain x 3 days that is unchanged. Pain is described as crampy and mild pressure. No aggravating or alleviating factors at this time. She also reports pain to the L arm, dizziness, worsening SOB with exertion, and HA. Pt has recently noted a "lump" to the crown of her head which she somewhat attributes to her HA. She has tried OTC Ibuprofen without any improvement for symptoms. Holly Moyer denies any vomiting or diaphoresis. No history of blood clots. No pain or swelling to legs. She denies any recent long distance travel. No current hormone use. PSHx includes partial hysterectomy in February 2015. Pt with known allergy to [place].  Past Medical History  Diagnosis Date  . Anemia   . Breast mass in female 2010    questionable mass, no f/u (fibroadenoma vs papilloma)  . Hypertension   . Headache(784.0)   . Pneumonia     2008   Past Surgical History  Procedure Laterality Date  . Colposcopy  2004  . Wisdom tooth extraction    . Cesarean section      x's 2  . Bilateral salpingectomy Bilateral 05/13/2013    Procedure: BILATERAL SALPINGECTOMY;  Surgeon: Azalia Bilis, MD;  Location: [facility];  Service: Gynecology;  Laterality: Bilateral;  . Supracervical abdominal hysterectomy N/A 05/13/2013    Procedure: HYSTERECTOMY SUPRACERVICAL ABDOMINAL;  Surgeon: Azalia Bilis, MD;  Location: [facility];  Service: Gynecology;  Laterality: N/A;  . Cysto N/A 05/13/2013    Procedure: Kathrene Alu;  Surgeon: Azalia Bilis, MD;  Location: [facility];  Service: Gynecology;   Laterality: N/A;   Family History  Problem Relation Age of Onset  . Hypertension Father   . Heart disease Father     28's  . Diabetes Maternal Grandmother   . Heart failure Maternal Grandmother   . Hypertension Paternal Grandmother   . Hypertension Paternal Grandfather    History  Substance Use Topics  . Smoking status: Never Smoker   . Smokeless tobacco: Never Used  . Alcohol Use: No   OB History    Gravida Para Term Preterm AB TAB SAB Ectopic Multiple Living   3 3  1      2      Review of Systems  Respiratory: Positive for shortness of breath.   Cardiovascular: Positive for chest pain.  Neurological: Positive for headaches.      Allergies  Macrobid  Home Medications   Prior to Admission medications   Medication Sig Start Date End Date Taking? Authorizing Provider  aspirin EC 81 MG tablet Take 81 mg by mouth daily.   Yes Historical Provider, MD  Cyanocobalamin (B-12 PO) Take 1 tablet by mouth daily.   Yes Historical Provider, MD  fexofenadine (ALLEGRA) 180 MG tablet Take 180 mg by mouth daily.   Yes Historical Provider, MD  FOLIC ACID PO Take 1 tablet by mouth daily.   Yes Historical Provider, MD  GARLIC PO Take 2 capsules by mouth daily.   Yes Historical  Provider, MD  ibuprofen (ADVIL,MOTRIN) 400 MG tablet Take 400 mg by mouth every 6 (six) hours as needed.   Yes Historical Provider, MD  IRON PO Take 1 tablet by mouth daily.   Yes Historical Provider, MD  Multiple Vitamin (MULTIVITAMIN) capsule Take 1 capsule by mouth daily.   Yes Historical Provider, MD  cyclobenzaprine (FLEXERIL) 10 MG tablet Take one in the morning, one in the afternoon, and 2 at bedtime for muscle accident Patient not taking: Reported on 05/14/2014 07/11/13   Holly Boyer, MD  HYDROcodone-acetaminophen (NORCO) 5-325 MG per tablet Take 1 tablet by mouth every 6 (six) hours as needed. Patient not taking: Reported on 05/14/2014 07/11/13   Holly Boyer, MD  naproxen (NAPROSYN) 500 MG tablet Take 1  tablet (500 mg total) by mouth 2 (two) times daily with a meal. Patient not taking: Reported on 05/14/2014 07/11/13   Holly Boyer, MD   Triage Vitals: BP 145/91 mmHg  Pulse 67  Temp(Src) 98.3 F (36.8 C) (Oral)  Resp 16  SpO2 99%  LMP 05/09/2013   Physical Exam  Constitutional: She is oriented to person, place, and time. She appears well-developed and well-nourished. No distress.  HENT:  Head: Normocephalic and atraumatic.  Nose: Nose normal.  Mouth/Throat: Oropharynx is clear and moist. No oropharyngeal exudate.  Eyes: Conjunctivae and EOM are normal. Pupils are equal, round, and reactive to light. No scleral icterus.  Neck: Normal range of motion. Neck supple. No JVD present. No tracheal deviation present. No thyromegaly present.  Cardiovascular: Normal rate, regular rhythm and normal heart sounds.  Exam reveals no gallop and no friction rub.   No murmur heard. Pulmonary/Chest: Effort normal and breath sounds normal. No respiratory distress. She has no wheezes. She exhibits no tenderness.  Abdominal: Soft. Bowel sounds are normal. She exhibits no distension and no mass. There is no tenderness. There is no rebound and no guarding.  Musculoskeletal: Normal range of motion. She exhibits no edema or tenderness.  Lymphadenopathy:    She has no cervical adenopathy.  Neurological: She is alert and oriented to person, place, and time. No cranial nerve deficit. She exhibits normal muscle tone.  Skin: Skin is warm and dry. No rash noted. No erythema. No pallor.  Nursing note and vitals reviewed.   ED Course  Procedures (including critical care time)  DIAGNOSTIC STUDIES: Oxygen Saturation is 99% on RA, Normal by my interpretation.    COORDINATION OF CARE: 11:33 PM- Will order blood work. Discussed treatment plan with pt at bedside and pt agreed to plan.     Labs Review Labs Reviewed  BASIC METABOLIC PANEL - Abnormal; Notable for the following:    Glucose, Bld 100 (*)    GFR calc  non Af Amer 69 (*)    GFR calc Af Amer 80 (*)    All other components within normal limits  CBC  BRAIN NATRIURETIC PEPTIDE  I-STAT TROPOININ, ED  Randolm Idol, ED    Imaging Review Dg Chest 2 View  05/14/2014   CLINICAL DATA:  Acute onset of chest pain radiating to the left arm. Shortness of breath and nausea. Initial encounter.  EXAM: CHEST  2 VIEW  COMPARISON:  Chest radiograph performed 02/19/2013  FINDINGS: The lungs are well-aerated and clear. There is no evidence of focal opacification, pleural effusion or pneumothorax. Minimal left basilar density is thought to reflect overlying soft tissues.  The heart is normal in size; the mediastinal contour is within normal limits. No acute osseous  abnormalities are seen.  IMPRESSION: No acute cardiopulmonary process seen.   Electronically Signed   By: Garald Balding M.D.   On: 05/14/2014 21:54     EKG Interpretation   Date/Time:  Friday May 14 2014 21:10:02 EST Ventricular Rate:  76 PR Interval:  134 QRS Duration: 76 QT Interval:  374 QTC Calculation: 420 R Axis:   70 Text Interpretation:  Normal sinus rhythm V3 misplaced lead No significant  change since last tracing Confirmed by Glynn Octave [phone]) on  05/14/2014 11:39:12 PM      MDM   Final diagnoses:  Chest pain    Patient presents emergency department for chest pain. Her history places her in low risk for chest pain. Will evaluate with a heart score and 2 sets of troponins. Her current heart score is 2.  Patient was advised that she would need another troponin 3 hours later, however she became impatient and told the nursing staff she wants to leave immediately. Nursing staff discharge the patient [place]. I have low concern that this patient's chest pain was due to ACS. Patient left prior to receiving any discharge instructions.  I personally performed the services described in this documentation, which was scribed in my presence. The recorded  information has been reviewed and is accurate.   Holly Balls, MD 05/15/14 [phone]

## 2014-05-14 NOTE — ED Notes (Signed)
Pt. reports left chest pain with SOB , nausea/ headache and diaphoresis onset last Wednesday.

## 2014-05-15 LAB — I-STAT TROPONIN, ED: Troponin i, poc: 0 ng/mL (ref 0.00–0.08)

## 2014-05-15 MED ORDER — DIPHENHYDRAMINE HCL 25 MG PO CAPS
50.0000 mg | ORAL_CAPSULE | Freq: Once | ORAL | Status: DC
  Filled 2014-05-15: qty 2

## 2014-05-15 MED ORDER — IBUPROFEN 800 MG PO TABS
800.0000 mg | ORAL_TABLET | Freq: Once | ORAL | Status: AC
  Administered 2014-05-15: 800 mg via ORAL
  Filled 2014-05-15: qty 1

## 2014-05-15 MED ORDER — METOCLOPRAMIDE HCL 10 MG PO TABS
10.0000 mg | ORAL_TABLET | Freq: Once | ORAL | Status: DC
  Filled 2014-05-15: qty 1

## 2014-05-15 NOTE — ED Notes (Signed)
Pt removed cardiac monitor by self and dressed self- this RN at bedside pt states "I cannot wait any longer".

## 2014-05-15 NOTE — ED Notes (Signed)
Pt refusing to stay for blood results- states "I have to leave now".  This RN explained risks to pt- pt still refusing to stay.  Pt ambulatory without difficulty to discharge desk.

## 2014-05-18 ENCOUNTER — Emergency Department (HOSPITAL_COMMUNITY)
Admission: EM | Admit: 2014-05-18 | Discharge: 2014-05-19 | Disposition: A | Discharge status: Home / Self Care | Payer: BLUE CROSS/BLUE SHIELD | Attending: Emergency Medicine | Admitting: Emergency Medicine

## 2014-05-18 ENCOUNTER — Encounter (HOSPITAL_COMMUNITY): Payer: Self-pay

## 2014-05-18 ENCOUNTER — Emergency Department (HOSPITAL_COMMUNITY): Payer: BLUE CROSS/BLUE SHIELD

## 2014-05-18 DIAGNOSIS — Z7982 Long term (current) use of aspirin: Secondary | ICD-10-CM | POA: Diagnosis not present

## 2014-05-18 DIAGNOSIS — Z791 Long term (current) use of non-steroidal anti-inflammatories (NSAID): Secondary | ICD-10-CM | POA: Insufficient documentation

## 2014-05-18 DIAGNOSIS — R220 Localized swelling, mass and lump, head: Secondary | ICD-10-CM | POA: Diagnosis not present

## 2014-05-18 DIAGNOSIS — R51 Headache: Secondary | ICD-10-CM | POA: Diagnosis not present

## 2014-05-18 DIAGNOSIS — R519 Headache, unspecified: Secondary | ICD-10-CM

## 2014-05-18 DIAGNOSIS — D649 Anemia, unspecified: Secondary | ICD-10-CM | POA: Insufficient documentation

## 2014-05-18 DIAGNOSIS — Z8742 Personal history of other diseases of the female genital tract: Secondary | ICD-10-CM | POA: Diagnosis not present

## 2014-05-18 DIAGNOSIS — I10 Essential (primary) hypertension: Secondary | ICD-10-CM | POA: Insufficient documentation

## 2014-05-18 DIAGNOSIS — Z8701 Personal history of pneumonia (recurrent): Secondary | ICD-10-CM | POA: Diagnosis not present

## 2014-05-18 DIAGNOSIS — Z79899 Other long term (current) drug therapy: Secondary | ICD-10-CM | POA: Insufficient documentation

## 2014-05-18 MED ORDER — KETOROLAC TROMETHAMINE 60 MG/2ML IM SOLN
60.0000 mg | Freq: Once | INTRAMUSCULAR | Status: AC
  Administered 2014-05-18: 60 mg via INTRAMUSCULAR
  Filled 2014-05-18: qty 2

## 2014-05-18 MED ORDER — BUTALBITAL-APAP-CAFFEINE 50-325-40 MG PO TABS: 1.0000 | ORAL_TABLET | Freq: Four times a day (QID) | ORAL | Status: DC | PRN

## 2014-05-18 NOTE — Discharge Instructions (Signed)

## 2014-05-18 NOTE — ED Provider Notes (Signed)
CSN: [id number]     Arrival date & time 05/18/14  1809 History   First MD Initiated Contact with Patient 05/18/14 2131     Chief Complaint  Patient presents with  . Migraine     (Consider location/radiation/quality/duration/timing/severity/associated sxs/prior Treatment) HPI Comments: Patient here complaining of persistent headache times several days. She also notes swelling to the top of her head without evidence of trauma or infection. Pain is been persistent and not associated fever or chills. No neck pain photophobia. Pain characterized as tender and worse with touching her head.. Using over-the-counter medications without relief. Denies any prior history of same. Denies any weakness to her arms or legs. Patient is to increase stress recently as well as decreased sleep  Patient is a 39 y.o. female presenting with migraines. The history is provided by the patient.  Migraine    Past Medical History  Diagnosis Date  . Anemia   . Breast mass in female 2010    questionable mass, no f/u (fibroadenoma vs papilloma)  . Hypertension   . Headache(784.0)   . Pneumonia     2008   Past Surgical History  Procedure Laterality Date  . Colposcopy  2004  . Wisdom tooth extraction    . Cesarean section      x's 2  . Bilateral salpingectomy Bilateral 05/13/2013    Procedure: BILATERAL SALPINGECTOMY;  Surgeon: Azalia Bilis, MD;  Location: [facility];  Service: Gynecology;  Laterality: Bilateral;  . Supracervical abdominal hysterectomy N/A 05/13/2013    Procedure: HYSTERECTOMY SUPRACERVICAL ABDOMINAL;  Surgeon: Azalia Bilis, MD;  Location: [facility];  Service: Gynecology;  Laterality: N/A;  . Cysto N/A 05/13/2013    Procedure: Kathrene Alu;  Surgeon: Azalia Bilis, MD;  Location: [facility];  Service: Gynecology;  Laterality: N/A;   Family History  Problem Relation Age of Onset  . Hypertension Father   . Heart disease Father     64's  . Diabetes Maternal Grandmother   . Heart failure Maternal  Grandmother   . Hypertension Paternal Grandmother   . Hypertension Paternal Grandfather    History  Substance Use Topics  . Smoking status: Never Smoker   . Smokeless tobacco: Never Used  . Alcohol Use: No   OB History    Gravida Para Term Preterm AB TAB SAB Ectopic Multiple Living   3 3  1      2      Review of Systems  All other systems reviewed and are negative.     Allergies  Macrobid  Home Medications   Prior to Admission medications   Medication Sig Start Date End Date Taking? Authorizing Provider  aspirin EC 81 MG tablet Take 81 mg by mouth daily.   Yes Historical Provider, MD  Cyanocobalamin (B-12 PO) Take 1 tablet by mouth daily.   Yes Historical Provider, MD  fexofenadine (ALLEGRA) 180 MG tablet Take 180 mg by mouth daily.   Yes Historical Provider, MD  FOLIC ACID PO Take 1 tablet by mouth daily.   Yes Historical Provider, MD  GARLIC PO Take 2 capsules by mouth daily.   Yes Historical Provider, MD  ibuprofen (ADVIL,MOTRIN) 400 MG tablet Take 800 mg by mouth every 6 (six) hours as needed for moderate pain (pain).    Yes Historical Provider, MD  IRON PO Take 1 tablet by mouth daily.   Yes Historical Provider, MD  Multiple Vitamin (MULTIVITAMIN) capsule Take 1 capsule by mouth daily.   Yes Historical Provider, MD  cyclobenzaprine (FLEXERIL)  10 MG tablet Take one in the morning, one in the afternoon, and 2 at bedtime for muscle accident Patient not taking: Reported on 05/14/2014 07/11/13   Posey Boyer, MD  HYDROcodone-acetaminophen (NORCO) 5-325 MG per tablet Take 1 tablet by mouth every 6 (six) hours as needed. Patient not taking: Reported on 05/14/2014 07/11/13   Posey Boyer, MD  naproxen (NAPROSYN) 500 MG tablet Take 1 tablet (500 mg total) by mouth 2 (two) times daily with a meal. Patient not taking: Reported on 05/14/2014 07/11/13   Posey Boyer, MD   BP 157/68 mmHg  Pulse 69  Temp(Src) 98.3 F (36.8 C) (Oral)  Resp 18  SpO2 98%  LMP 05/09/2013 Physical  Exam  Constitutional: She is oriented to person, place, and time. She appears well-developed and well-nourished.  Non-toxic appearance. No distress.  HENT:  Head: Normocephalic and atraumatic. Head is without abrasion and without contusion.    Eyes: Conjunctivae, EOM and lids are normal. Pupils are equal, round, and reactive to light.  Neck: Normal range of motion. Neck supple. No tracheal deviation present. No thyroid mass present.  Cardiovascular: Normal rate, regular rhythm and normal heart sounds.  Exam reveals no gallop.   No murmur heard. Pulmonary/Chest: Effort normal and breath sounds normal. No stridor. No respiratory distress. She has no decreased breath sounds. She has no wheezes. She has no rhonchi. She has no rales.  Abdominal: Soft. Normal appearance and bowel sounds are normal. She exhibits no distension. There is no tenderness. There is no rebound and no CVA tenderness.  Musculoskeletal: Normal range of motion. She exhibits no edema or tenderness.  Neurological: She is alert and oriented to person, place, and time. She has normal strength. No cranial nerve deficit or sensory deficit. GCS eye subscore is 4. GCS verbal subscore is 5. GCS motor subscore is 6.  Skin: Skin is warm and dry. No abrasion and no rash noted.  Psychiatric: She has a normal mood and affect. Her speech is normal and behavior is normal.  Nursing note and vitals reviewed.   ED Course  Procedures (including critical care time) Labs Review Labs Reviewed - No data to display  Imaging Review No results found.   EKG Interpretation None      MDM   Final diagnoses:  Headache    Pt given toradol and feels better--head ct neg, no concern for sah or meningitis, repeat neuro at d/c stable    Leota Jacobsen, MD 05/18/14 2302

## 2014-05-18 NOTE — ED Notes (Signed)
Migraine x 1 week.  Dizzy with nausea.  No vomiting.  Pt also states feels bump on head

## 2014-12-17 ENCOUNTER — Telehealth: Payer: Self-pay | Admitting: Cardiovascular Disease

## 2014-12-17 NOTE — Telephone Encounter (Signed)
Received records from [facility] [place] for appointment with Dr Oval Linsey on 01/05/15.  Records given to [facility] (medical records) for Dr Blenda Mounts schedule on 01/05/15. lp

## 2015-01-05 ENCOUNTER — Ambulatory Visit (INDEPENDENT_AMBULATORY_CARE_PROVIDER_SITE_OTHER): Payer: BLUE CROSS/BLUE SHIELD | Admitting: Cardiovascular Disease

## 2015-01-05 ENCOUNTER — Encounter: Payer: Self-pay | Admitting: Cardiovascular Disease

## 2015-01-05 VITALS — BP 134/86 | HR 68 | Ht 64.0 in | Wt 190.0 lb

## 2015-01-05 DIAGNOSIS — Z13220 Encounter for screening for lipoid disorders: Secondary | ICD-10-CM

## 2015-01-05 DIAGNOSIS — R072 Precordial pain: Secondary | ICD-10-CM | POA: Diagnosis not present

## 2015-01-05 NOTE — Patient Instructions (Signed)
Labs - CBC,CMP,LIPID - NOTHING TO EAT OR DRINK MORNING OF TEST.  Your physician has requested that you have an exercise tolerance test. For further information please visit [email or web address]. Please also follow instruction sheet, as given.  WILL CALL YOU WITH RESULTS.   Your physician recommends that you schedule a follow-up appointment in [place]

## 2015-01-05 NOTE — Progress Notes (Signed)
Cardiology Office Note   Date:  01/05/2015   ID:  [name], DOB 1975-11-10, MRN [id number]  PCP:  Florina Ou, MD  Cardiologist:   Sharol Harness, MD   Chief Complaint  Patient presents with  . Appointment    chest pain, shob with exercise. no swelling, dizziness nor light headed feelings      History of Present Illness: Holly Moyer is a 39 y.o. female who presents for an evaluation of fatigue and chest pain.  She reports chest pressure that radiates from her chest to her left shoulder and arm.  It is worse when she is stressed and has been getting worse over the last 6 months.  She was seen in the ED in her 20's reporting chest pain that did not get a full work up. She had labs drawn but did not undergo any stress testing, as she was told that she was low risk.   Lately the pain has been worse.  The pain occurs daily whenever she does not take aspirin.  It is mild pressure most of the time and sharp at times.  The pain is constant throughout the day.  It is associated with shortness of breath, nausea, and diaphoresis at night.  She denies palpitations, lower extremity edema, orthopnea or PND.  In the past when she had the symptoms she was found to be profoundly anemic with hemoglobin of 6 due to menorrhagia. She has since undergone partial hysterectomy and no longer has her menstrual cycle.  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  In the past her pain improved with exercise.  Recently the pain either stays the same or gets worse.  She notes dyspnea after walking up a flight of stairs.  Ms. Royse has been working on her diet. She's beginning instead of trying her foods. She also limits her salt intake and has increased her intake of fruits and vegetables. She is eating less meat.  Ms. Lopezmartinez biological father died of a heart attack at age 27. Both her paternal grandparents had multiple heart attacks and her  maternal grandmother has heart failure.    Past Medical History  Diagnosis Date  . Anemia   . Breast mass in female 2010    questionable mass, no f/u (fibroadenoma vs papilloma)  . Hypertension   . Headache(784.0)   . Pneumonia     2008    Past Surgical History  Procedure Laterality Date  . Colposcopy  2004  . Wisdom tooth extraction    . Cesarean section      x's 2  . Bilateral salpingectomy Bilateral 05/13/2013    Procedure: BILATERAL SALPINGECTOMY;  Surgeon: Azalia Bilis, MD;  Location: [facility];  Service: Gynecology;  Laterality: Bilateral;  . Supracervical abdominal hysterectomy N/A 05/13/2013    Procedure: HYSTERECTOMY SUPRACERVICAL ABDOMINAL;  Surgeon: Azalia Bilis, MD;  Location: [facility];  Service: Gynecology;  Laterality: N/A;  . Cysto N/A 05/13/2013    Procedure: Kathrene Alu;  Surgeon: Azalia Bilis, MD;  Location: [facility];  Service: Gynecology;  Laterality: N/A;     Current Outpatient Prescriptions  Medication Sig Dispense Refill  . aspirin EC 81 MG tablet Take 81 mg by mouth daily.    . fexofenadine (ALLEGRA) 180 MG tablet Take 180 mg by mouth daily.    Marland Kitchen GARLIC PO Take 2 capsules by mouth daily.    Marland Kitchen ibuprofen (ADVIL,MOTRIN)  400 MG tablet Take 800 mg by mouth every 6 (six) hours as needed for moderate pain (pain).     . Multiple Vitamin (MULTIVITAMIN) capsule Take 1 capsule by mouth daily.     No current facility-administered medications for this visit.    Allergies:   Macrobid    Social History:  The patient  reports that she has never smoked. She has never used smokeless tobacco. She reports that she does not drink alcohol or use illicit drugs.   Family History:  The patient's family history includes Diabetes in her maternal grandmother; Heart attack (age of onset: 74) in her father; Heart disease in her father; Heart failure in her maternal grandmother; Hypertension in her father, paternal grandfather, and paternal grandmother; Ovarian cancer in her maternal  grandmother.    ROS:  Please see the history of present illness.   Otherwise, review of systems are positive for none.   All other systems are reviewed and negative.    PHYSICAL EXAM: VS:  BP 134/86 mmHg  Pulse 68  Ht 5\' 4"  (1.626 m)  Wt 86.183 kg (190 lb)  BMI 32.60 kg/m2  LMP 05/09/2013 , BMI Body mass index is 32.6 kg/(m^2). GENERAL:  Well appearing HEENT:  Pupils equal round and reactive, fundi not visualized, oral mucosa unremarkable NECK:  No jugular venous distention, waveform within normal limits, carotid upstroke brisk and symmetric, no bruits, no thyromegaly LYMPHATICS:  No cervical adenopathy LUNGS:  Clear to auscultation bilaterally HEART:  RRR.  PMI not displaced or sustained,S1 and S2 within normal limits, no S3, no S4, no clicks, no rubs, no murmurs ABD:  Flat, positive bowel sounds normal in frequency in pitch, no bruits, no rebound, no guarding, no midline pulsatile mass, no hepatomegaly, no splenomegaly EXT:  2 plus pulses throughout, no edema, no cyanosis no clubbing SKIN:  No rashes no nodules NEURO:  Cranial nerves II through XII grossly intact, motor grossly intact throughout PSYCH:  Cognitively intact, oriented to person place and time    EKG:  EKG is ordered today. The ekg ordered today demonstrates sinus rhythm at 68 bpm.    Recent Labs: 05/14/2014: B Natriuretic Peptide 7.5; BUN 14; Creatinine, Ser 1.02; Hemoglobin 13.6; Platelets 250; Potassium 4.1; Sodium 138    Lipid Panel    Component Value Date/Time   CHOL 187 06/30/2008 2156   TRIG 64 06/30/2008 2156   HDL 59 06/30/2008 2156   CHOLHDL 3.2 Ratio 06/30/2008 2156   VLDL 13 06/30/2008 2156   LDLCALC 115* 06/30/2008 2156      Wt Readings from Last 3 Encounters:  01/05/15 86.183 kg (190 lb)  07/11/13 82.101 kg (181 lb)  06/22/13 82.555 kg (182 lb)      Other studies Reviewed: Additional studies/ records that were reviewed today include: . Review of the above records demonstrates:   Please see elsewhere in the note.     ASSESSMENT AND PLAN:  # Atypical chest pain: Symptoms are atypical in that they are constant.  However, she has a first degree relative with premature CAD and MI. - exercise treadmill stress test - CBC to assess for anemia - OK to continue aspirin    # CV Disease Prevention: Ms. Lillo is making a lot of excellent changes.  She was encouraged to increase her exercise to 30-40 minutes of aerobic exercise most days of the week. - Check lipids and CMP  Current medicines are reviewed at length with the patient today.  The patient does not have concerns  regarding medicines.  The following changes have been made:  no change  Labs/ tests ordered today include:  No orders of the defined types were placed in this encounter.    Disposition:   FU with Conleigh Heinlein C. Oval Linsey, MD as needed   Signed, Sharol Harness, MD  01/05/2015 9:13 AM    [place]

## 2015-02-01 ENCOUNTER — Telehealth (HOSPITAL_COMMUNITY): Payer: Self-pay

## 2015-02-01 NOTE — Telephone Encounter (Signed)
Encounter complete. 

## 2015-02-03 ENCOUNTER — Ambulatory Visit (HOSPITAL_COMMUNITY)
Admission: RE | Admit: 2015-02-03 | Discharge: 2015-02-03 | Disposition: A | Discharge status: Home / Self Care | Payer: BLUE CROSS/BLUE SHIELD | Source: Ambulatory Visit | Attending: Cardiovascular Disease | Admitting: Cardiovascular Disease

## 2015-02-03 DIAGNOSIS — R072 Precordial pain: Secondary | ICD-10-CM | POA: Insufficient documentation

## 2015-02-03 DIAGNOSIS — Z13220 Encounter for screening for lipoid disorders: Secondary | ICD-10-CM | POA: Insufficient documentation

## 2015-02-03 LAB — CBC
HEMATOCRIT: 40.1 % (ref 36.0–46.0)
HEMOGLOBIN: 13.3 g/dL (ref 12.0–15.0)
MCH: 28.9 pg (ref 26.0–34.0)
MCHC: 33.2 g/dL (ref 30.0–36.0)
MCV: 87 fL (ref 78.0–100.0)
MPV: 10.6 fL (ref 8.6–12.4)
Platelets: 296 10*3/uL (ref 150–400)
RBC: 4.61 MIL/uL (ref 3.87–5.11)
RDW: 13.7 % (ref 11.5–15.5)
WBC: 5.5 10*3/uL (ref 4.0–10.5)

## 2015-02-04 LAB — COMPREHENSIVE METABOLIC PANEL
ALBUMIN: 4.1 g/dL (ref 3.6–5.1)
ALK PHOS: 54 U/L (ref 33–115)
ALT: 13 U/L (ref 6–29)
AST: 16 U/L (ref 10–30)
BILIRUBIN TOTAL: 0.7 mg/dL (ref 0.2–1.2)
BUN: 10 mg/dL (ref 7–25)
CALCIUM: 9.5 mg/dL (ref 8.6–10.2)
CO2: 27 mmol/L (ref 20–31)
Chloride: 104 mmol/L (ref 98–110)
Creat: 0.96 mg/dL (ref 0.50–1.10)
Glucose, Bld: 83 mg/dL (ref 65–99)
Potassium: 4.6 mmol/L (ref 3.5–5.3)
Sodium: 139 mmol/L (ref 135–146)
Total Protein: 7.3 g/dL (ref 6.1–8.1)

## 2015-02-04 LAB — EXERCISE TOLERANCE TEST
CSEPHR: 106 %
CSEPPHR: 193 {beats}/min
Estimated workload: 11.2 METS
Exercise duration (min): 9 min
Exercise duration (sec): 44 s
MPHR: 182 {beats}/min
RPE: 17
Rest HR: 64 {beats}/min

## 2015-02-04 LAB — LIPID PANEL
Cholesterol: 183 mg/dL (ref 125–200)
HDL: 52 mg/dL (ref >=46)
LDL Cholesterol: 115 mg/dL (ref <130)
Total CHOL/HDL Ratio: 3.5 Ratio (ref <=5.0)
Triglycerides: 79 mg/dL (ref <150)
VLDL: 16 mg/dL (ref <30)

## 2015-02-07 ENCOUNTER — Telehealth: Payer: Self-pay | Admitting: *Deleted

## 2015-02-07 NOTE — Telephone Encounter (Signed)
-----   Message from Skeet Latch, MD sent at 02/07/2015  3:00 PM EST ----- Normal stress test.

## 2015-02-07 NOTE — Telephone Encounter (Signed)
Spoke to patient. Labs and stress test Result given . Verbalized understanding\

## 2015-02-07 NOTE — Telephone Encounter (Signed)
-----   Message from Skeet Latch, MD sent at 02/07/2015  3:08 PM EST ----- Labs are all normal.

## 2016-01-31 ENCOUNTER — Encounter: Payer: Self-pay | Admitting: Emergency Medicine

## 2016-05-10 ENCOUNTER — Ambulatory Visit: Payer: BLUE CROSS/BLUE SHIELD | Admitting: Family Medicine

## 2016-06-01 ENCOUNTER — Ambulatory Visit (INDEPENDENT_AMBULATORY_CARE_PROVIDER_SITE_OTHER): Payer: 59 | Admitting: Certified Nurse Midwife

## 2016-06-01 ENCOUNTER — Encounter: Payer: Self-pay | Admitting: Certified Nurse Midwife

## 2016-06-01 VITALS — BP 138/82 | HR 90 | Resp 24 | Ht 64.0 in | Wt 196.8 lb

## 2016-06-01 DIAGNOSIS — N3946 Mixed incontinence: Secondary | ICD-10-CM

## 2016-06-01 DIAGNOSIS — Z01419 Encounter for gynecological examination (general) (routine) without abnormal findings: Secondary | ICD-10-CM

## 2016-06-01 DIAGNOSIS — Z124 Encounter for screening for malignant neoplasm of cervix: Secondary | ICD-10-CM

## 2016-06-01 DIAGNOSIS — Z0000 Encounter for general adult medical examination without abnormal findings: Secondary | ICD-10-CM | POA: Diagnosis not present

## 2016-06-01 LAB — POCT URINALYSIS DIPSTICK
Bilirubin, UA: NEGATIVE
Glucose, UA: NEGATIVE
Ketones, UA: NEGATIVE
LEUKOCYTES UA: NEGATIVE
NITRITE UA: NEGATIVE
PROTEIN UA: NEGATIVE
Urobilinogen, UA: NEGATIVE
pH, UA: 5

## 2016-06-01 NOTE — Progress Notes (Signed)
41 y.o. [id number] Single  African American Fe here for annual exam. Complaining of external itching off and on for the past for several months. Has tried Summer Eve use and [employer] and these are irritating. Started occurring after Hysterectomy. Some incontinence with holding urine too long, but feel may have increased. Some urine odor at times. Working on water intake. Denies dysuria or blood in urine, frequency or back pain. Not sexually active, no vaginal dryness. Does wear pads daily for leakage if occurs. Sees  PCP  Dr Oval Linsey for aex and labs.. No other health issues today.   Patient's last menstrual period was 05/09/2013.          Sexually active: No The current method of family planning is supracervical hysterectomy.    Exercising: Yes.    Cardio, walking Smoker:  no  Health Maintenance: Pap:  Unsure MMG:  07-06-08 bilateral & rt breast u/s category 3 prob benign, short interval f/u suggested Colonoscopy: Never BMD:   Never TDaP:  2010 Shingles: no Pneumonia: no HIV: with pregnancies  Labs: poct urine-tr Self breast exam: yes   reports that she has never smoked. She has never used smokeless tobacco. She reports that she does not drink alcohol or use drugs.  Past Medical History:  Diagnosis Date  . Anemia   . Breast mass in female 2010   questionable mass, no f/u (fibroadenoma vs papilloma)  . Headache(784.0)   . Hypertension   . Pneumonia    2008    Past Surgical History:  Procedure Laterality Date  . BILATERAL SALPINGECTOMY Bilateral 05/13/2013   Procedure: BILATERAL SALPINGECTOMY;  Surgeon: Azalia Bilis, MD;  Location: [facility];  Service: Gynecology;  Laterality: Bilateral;  . CESAREAN SECTION     x's 2  . COLPOSCOPY  2004  . CYSTO N/A 05/13/2013   Procedure: CYSTO;  Surgeon: Azalia Bilis, MD;  Location: [facility];  Service: Gynecology;  Laterality: N/A;  . SUPRACERVICAL ABDOMINAL HYSTERECTOMY N/A 05/13/2013   Procedure: HYSTERECTOMY SUPRACERVICAL ABDOMINAL;   Surgeon: Azalia Bilis, MD;  Location: [facility];  Service: Gynecology;  Laterality: N/A;  . WISDOM TOOTH EXTRACTION      Current Outpatient Prescriptions  Medication Sig Dispense Refill  . aspirin EC 81 MG tablet Take 81 mg by mouth daily.    . fexofenadine (ALLEGRA) 180 MG tablet Take 180 mg by mouth daily.    Marland Kitchen GARLIC PO Take [id number] mg by mouth daily.     Marland Kitchen ibuprofen (ADVIL,MOTRIN) 400 MG tablet Take 800 mg by mouth every 6 (six) hours as needed for moderate pain (pain).     . Multiple Vitamin (MULTIVITAMIN) capsule Take 1 capsule by mouth daily.    . naproxen sodium (ANAPROX) 550 MG tablet TK 1 T PO  BID PRN  0   No current facility-administered medications for this visit.     Family History  Problem Relation Age of Onset  . Hypertension Father   . Heart disease Father     MI at age 56  . Heart attack Father 54  . Diabetes Maternal Grandmother   . Heart failure Maternal Grandmother   . Ovarian cancer Maternal Grandmother   . Hypertension Paternal Grandmother   . Hypertension Paternal Grandfather     ROS:  Pertinent items are noted in HPI.  Otherwise, a comprehensive ROS was negative.  Exam:   BP 138/82 (BP Location: Right Arm, Patient Position: Sitting, Cuff Size: Normal)   Pulse 90   Resp (!) 24  Ht 5\' 4"  (1.626 m)   Wt 196 lb 12.8 oz (89.3 kg)   LMP 05/09/2013 Comment: Spotting only  BMI 33.78 kg/m  Height: 5\' 4"  (162.6 cm) Ht Readings from Last 3 Encounters:  06/01/16 5\' 4"  (1.626 m)  01/05/15 5\' 4"  (1.626 m)  07/11/13 5\' 4"  (1.626 m)    General appearance: alert, cooperative and appears stated age Head: Normocephalic, without obvious abnormality, atraumatic Neck: no adenopathy, supple, symmetrical, trachea midline and thyroid normal to inspection and palpation and non-palpable Lungs: clear to auscultation bilaterally CVAT negative bilateral Breasts: normal appearance, no masses or tenderness, No nipple retraction or dimpling, No nipple discharge or bleeding, No  axillary or supraclavicular adenopathy Heart: regular rate and rhythm Abdomen: soft, non-tender; no masses,  no organomegaly, negative suprapubic Extremities: extremities normal, atraumatic, no cyanosis or edema Skin: Skin color, texture, turgor normal. No rashes or lesions, warm and dry Lymph nodes: Cervical, supraclavicular, and axillary nodes normal. No abnormal inguinal nodes palpated Neurologic: Grossly normal   Pelvic: External genitalia:  no lesions              Urethra:  normal appearing urethra with no masses, tenderness or lesions  Bladder and urethral meatus non tender, no leaking with cough              Bartholin's and Skene's: normal                 Vagina: normal appearing vagina with normal color and discharge, no lesions, no cystocele noted, good support              Cervix: no bleeding following Pap, no cervical motion tenderness and no lesions              Pap taken: Yes.   Bimanual Exam:  Uterus:  normal size, contour, position, consistency, mobility, non-tender              Adnexa: normal adnexa and no mass, fullness, tenderness               Rectovaginal: Confirms               Anus:  normal sphincter tone, no lesions  Chaperone present: yes  A:  Well Woman with normal exam  Supracervical hysterectomy with ovaries retained for menorrhagia  Obesity aware she should working on weight loss  Stress ? Mixed incontinence R/O asymptomatic bacturia    P:   Reviewed health and wellness pertinent to exam  Aware she needs to advise if vaginal bleeding  Continue follow up with PCP as indicated  Discussed increasing water intake, empty bladder with first or second urge. Sit long enough to empty. Or stand and then try to empty again. Warning signs of UTI given and need to advise. Questions answered at length  Lab: Urine culture  Pap smear as above with HPVHR  counseled on breast self exam, mammography screening, STD prevention, HIV risk factors and prevention, adequate  intake of calcium and vitamin D, diet and exercise  return annually or prn  An After Visit Summary was printed and given to the patient.

## 2016-06-01 NOTE — Patient Instructions (Signed)

## 2016-06-02 LAB — URINE CULTURE

## 2016-06-06 LAB — IPS PAP TEST WITH HPV

## 2016-06-07 NOTE — Progress Notes (Signed)
Encounter reviewed Holly Ode, MD   

## 2016-06-27 ENCOUNTER — Ambulatory Visit (INDEPENDENT_AMBULATORY_CARE_PROVIDER_SITE_OTHER): Payer: 59 | Admitting: Family Medicine

## 2016-06-27 ENCOUNTER — Encounter: Payer: Self-pay | Admitting: Family Medicine

## 2016-06-27 DIAGNOSIS — I10 Essential (primary) hypertension: Secondary | ICD-10-CM | POA: Diagnosis not present

## 2016-06-27 DIAGNOSIS — E669 Obesity, unspecified: Secondary | ICD-10-CM | POA: Insufficient documentation

## 2016-06-27 LAB — CBC WITH DIFFERENTIAL/PLATELET
BASOS PCT: 0.4 % (ref 0.0–3.0)
Basophils Absolute: 0 10*3/uL (ref 0.0–0.1)
EOS PCT: 0.3 % (ref 0.0–5.0)
Eosinophils Absolute: 0 10*3/uL (ref 0.0–0.7)
HCT: 39.9 % (ref 36.0–46.0)
HEMOGLOBIN: 13.2 g/dL (ref 12.0–15.0)
Lymphocytes Relative: 38.3 % (ref 12.0–46.0)
Lymphs Abs: 2.7 10*3/uL (ref 0.7–4.0)
MCHC: 33.2 g/dL (ref 30.0–36.0)
MCV: 87.6 fl (ref 78.0–100.0)
MONO ABS: 0.6 10*3/uL (ref 0.1–1.0)
Monocytes Relative: 7.9 % (ref 3.0–12.0)
Neutro Abs: 3.8 10*3/uL (ref 1.4–7.7)
Neutrophils Relative %: 53.1 % (ref 43.0–77.0)
Platelets: 250 10*3/uL (ref 150.0–400.0)
RBC: 4.55 Mil/uL (ref 3.87–5.11)
RDW: 13.6 % (ref 11.5–15.5)
WBC: 7.2 10*3/uL (ref 4.0–10.5)

## 2016-06-27 LAB — LIPID PANEL
CHOLESTEROL: 200 mg/dL (ref 0–200)
HDL: 52.8 mg/dL (ref >39.00)
LDL Cholesterol: 125 mg/dL — ABNORMAL HIGH (ref 0–99)
NonHDL: 146.75
Total CHOL/HDL Ratio: 4
Triglycerides: 110 mg/dL (ref 0.0–149.0)
VLDL: 22 mg/dL (ref 0.0–40.0)

## 2016-06-27 LAB — BASIC METABOLIC PANEL
BUN: 14 mg/dL (ref 6–23)
CALCIUM: 10 mg/dL (ref 8.4–10.5)
CHLORIDE: 107 meq/L (ref 96–112)
CO2: 28 mEq/L (ref 19–32)
CREATININE: 0.97 mg/dL (ref 0.40–1.20)
GFR: 81.92 mL/min (ref >60.00)
Glucose, Bld: 94 mg/dL (ref 70–99)
Potassium: 3.9 mEq/L (ref 3.5–5.1)
Sodium: 139 mEq/L (ref 135–145)

## 2016-06-27 LAB — HEMOGLOBIN A1C: HEMOGLOBIN A1C: 5.9 % (ref 4.6–6.5)

## 2016-06-27 LAB — HEPATIC FUNCTION PANEL
ALK PHOS: 45 U/L (ref 39–117)
ALT: 9 U/L (ref 0–35)
AST: 13 U/L (ref 0–37)
Albumin: 4.4 g/dL (ref 3.5–5.2)
BILIRUBIN DIRECT: 0.1 mg/dL (ref 0.0–0.3)
BILIRUBIN TOTAL: 0.6 mg/dL (ref 0.2–1.2)
Total Protein: 7.3 g/dL (ref 6.0–8.3)

## 2016-06-27 LAB — TSH: TSH: 1.37 u[IU]/mL (ref 0.35–4.50)

## 2016-06-27 NOTE — Assessment & Plan Note (Signed)
New dx.  Pt reports BP has been elevated in the past and she has a strong family hx of HTN and CAD.  She is not interested in BP meds at this time as she has 'a plan' to lose weight and improve her BP.  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  She is aware that if there is no improvement in 3 months, she will need to start medication and she is in agreement w/ this.  Reviewed supportive care and red flags that should prompt return.  Pt expressed understanding and is in agreement w/ plan.

## 2016-06-27 NOTE — Patient Instructions (Signed)
Follow up in 3 months to recheck BP and weight loss progress We'll notify you of your lab results and make any changes if needed Continue to work on healthy diet and regular exercise- you can do it!!! Call with any questions or concerns Welcome!  We're glad to have you!!!

## 2016-06-27 NOTE — Progress Notes (Signed)
   Subjective:    Patient ID: Holly Moyer, female    DOB: [birth year], 41 y.o.   MRN: [id number]  HPI New to establish.  Previous MD- ([facility]) [place]- UTD on pap  Elevated BP- 'it's been an issue'.  Pt has a 'plan in place' for weight loss- exercise, diet, and stress management.  Pt is attempting to limit sodium.  + family hx of HTN, CAD.  No CP (hx of this in the past- had cards work up w/ normal stress test), SOB, HAs, visual changes.   Review of Systems For ROS see HPI     Objective:   Physical Exam  Constitutional: She is oriented to person, place, and time. She appears well-developed and well-nourished. No distress.  HENT:  Head: Normocephalic and atraumatic.  Eyes: Conjunctivae and EOM are normal. Pupils are equal, round, and reactive to light.  Neck: Normal range of motion. Neck supple. No thyromegaly present.  Cardiovascular: Normal rate, regular rhythm, normal heart sounds and intact distal pulses.   No murmur heard. Pulmonary/Chest: Effort normal and breath sounds normal. No respiratory distress.  Abdominal: Soft. She exhibits no distension. There is no tenderness.  Musculoskeletal: She exhibits no edema.  Lymphadenopathy:    She has no cervical adenopathy.  Neurological: She is alert and oriented to person, place, and time.  Skin: Skin is warm and dry.  Psychiatric: She has a normal mood and affect. Her behavior is normal.  Vitals reviewed.         Assessment & Plan:

## 2016-06-27 NOTE — Assessment & Plan Note (Signed)
New to provider, ongoing for pt.  She is aware and has good insight into this and the impact it has on her health.  She has a plan to work on diet and exercise.  Check labs to risk stratify.  Will follow closely.

## 2016-09-24 ENCOUNTER — Ambulatory Visit: Payer: 59 | Admitting: Family Medicine

## 2016-12-27 ENCOUNTER — Encounter: Payer: Self-pay | Admitting: Family Medicine

## 2016-12-27 ENCOUNTER — Ambulatory Visit (INDEPENDENT_AMBULATORY_CARE_PROVIDER_SITE_OTHER): Payer: 59 | Admitting: Family Medicine

## 2016-12-27 VITALS — BP 123/83 | HR 78 | Temp 98.6°F | Resp 16 | Ht 64.0 in | Wt 192.5 lb

## 2016-12-27 DIAGNOSIS — Z23 Encounter for immunization: Secondary | ICD-10-CM | POA: Diagnosis not present

## 2016-12-27 DIAGNOSIS — E669 Obesity, unspecified: Secondary | ICD-10-CM | POA: Diagnosis not present

## 2016-12-27 DIAGNOSIS — Z0000 Encounter for general adult medical examination without abnormal findings: Secondary | ICD-10-CM

## 2016-12-27 LAB — LIPID PANEL
Cholesterol: 194 mg/dL (ref 0–200)
HDL: 52 mg/dL (ref >39.00)
LDL Cholesterol: 110 mg/dL — ABNORMAL HIGH (ref 0–99)
NonHDL: 141.76
Total CHOL/HDL Ratio: 4
Triglycerides: 158 mg/dL — ABNORMAL HIGH (ref 0.0–149.0)
VLDL: 31.6 mg/dL (ref 0.0–40.0)

## 2016-12-27 LAB — BASIC METABOLIC PANEL
BUN: 13 mg/dL (ref 6–23)
CHLORIDE: 104 meq/L (ref 96–112)
CO2: 30 mEq/L (ref 19–32)
Calcium: 10 mg/dL (ref 8.4–10.5)
Creatinine, Ser: 0.93 mg/dL (ref 0.40–1.20)
GFR: 85.78 mL/min (ref >60.00)
GLUCOSE: 91 mg/dL (ref 70–99)
POTASSIUM: 4.3 meq/L (ref 3.5–5.1)
SODIUM: 139 meq/L (ref 135–145)

## 2016-12-27 LAB — CBC WITH DIFFERENTIAL/PLATELET
BASOS ABS: 0 10*3/uL (ref 0.0–0.1)
Basophils Relative: 0.2 % (ref 0.0–3.0)
Eosinophils Absolute: 0 10*3/uL (ref 0.0–0.7)
Eosinophils Relative: 0.3 % (ref 0.0–5.0)
HCT: 42.1 % (ref 36.0–46.0)
Hemoglobin: 13.8 g/dL (ref 12.0–15.0)
LYMPHS ABS: 2.9 10*3/uL (ref 0.7–4.0)
Lymphocytes Relative: 41.5 % (ref 12.0–46.0)
MCHC: 32.8 g/dL (ref 30.0–36.0)
MCV: 89.7 fl (ref 78.0–100.0)
MONO ABS: 0.6 10*3/uL (ref 0.1–1.0)
Monocytes Relative: 8.1 % (ref 3.0–12.0)
NEUTROS ABS: 3.5 10*3/uL (ref 1.4–7.7)
NEUTROS PCT: 49.9 % (ref 43.0–77.0)
PLATELETS: 230 10*3/uL (ref 150.0–400.0)
RBC: 4.69 Mil/uL (ref 3.87–5.11)
RDW: 13.6 % (ref 11.5–15.5)
WBC: 7 10*3/uL (ref 4.0–10.5)

## 2016-12-27 LAB — HEPATIC FUNCTION PANEL
ALK PHOS: 45 U/L (ref 39–117)
ALT: 8 U/L (ref 0–35)
AST: 12 U/L (ref 0–37)
Albumin: 4.4 g/dL (ref 3.5–5.2)
BILIRUBIN DIRECT: 0.1 mg/dL (ref 0.0–0.3)
TOTAL PROTEIN: 7.4 g/dL (ref 6.0–8.3)
Total Bilirubin: 0.6 mg/dL (ref 0.2–1.2)

## 2016-12-27 LAB — TSH: TSH: 0.78 u[IU]/mL (ref 0.35–4.50)

## 2016-12-27 NOTE — Patient Instructions (Signed)
Follow up in 1 year or as needed We'll notify you of your lab results and make any changes if needed Continue to work on healthy diet and regular exercise- you can do it! Call with any questions or concerns Happy Fall!! 

## 2016-12-27 NOTE — Progress Notes (Signed)
Pre visit review using our clinic review tool, if applicable. No additional management support is needed unless otherwise documented below in the visit note. 

## 2016-12-27 NOTE — Progress Notes (Signed)
   Subjective:    Patient ID: Holly Moyer, female    DOB: Feb 03, 1976, 41 y.o.   MRN: [id number]  HPI CPE- UTD on pap Holly Moyer).  UTD on Tdap, will get flu today.  Pt is working on [employer] and regular exercise.   Review of Systems Patient reports no vision/ hearing changes, adenopathy,fever, weight change,  persistant/recurrent hoarseness , swallowing issues, chest pain, palpitations, edema, persistant/recurrent cough, hemoptysis, dyspnea (rest/exertional/paroxysmal nocturnal), gastrointestinal bleeding (melena, rectal bleeding), abdominal pain, significant heartburn, bowel changes, GU symptoms (dysuria, hematuria, incontinence), Gyn symptoms (abnormal  bleeding, pain),  syncope, focal weakness, memory loss, numbness & tingling, skin/hair/nail changes, abnormal bruising or bleeding, anxiety, or depression.     Objective:   Physical Exam General Appearance:    Alert, cooperative, no distress, appears stated age  Head:    Normocephalic, without obvious abnormality, atraumatic  Eyes:    PERRL, conjunctiva/corneas clear, EOM's intact, fundi    benign, both eyes  Ears:    Normal TM's and external ear canals, both ears  Nose:   Nares normal, septum midline, mucosa normal, no drainage    or sinus tenderness  Throat:   Lips, mucosa, and tongue normal; teeth and gums normal  Neck:   Supple, symmetrical, trachea midline, no adenopathy;    Thyroid: no enlargement/tenderness/nodules  Back:     Symmetric, no curvature, ROM normal, no CVA tenderness  Lungs:     Clear to auscultation bilaterally, respirations unlabored  Chest Wall:    No tenderness or deformity   Heart:    Regular rate and rhythm, S1 and S2 normal, no murmur, rub   or gallop  Breast Exam:    Deferred to GYN  Abdomen:     Soft, non-tender, bowel sounds active all four quadrants,    no masses, no organomegaly  Genitalia:    Deferred to GYN  Rectal:    Extremities:   Extremities normal, atraumatic, no cyanosis or edema    Pulses:   2+ and symmetric all extremities  Skin:   Skin color, texture, turgor normal, no rashes or lesions  Lymph nodes:   Cervical, supraclavicular, and axillary nodes normal  Neurologic:   CNII-XII intact, normal strength, sensation and reflexes    throughout          Assessment & Plan:

## 2016-12-27 NOTE — Assessment & Plan Note (Signed)
Pt's PE WNL w/ exception of obesity.  UTD on pap.  Scheduled to get mammo at work.  Check labs.  Anticipatory guidance provided.

## 2016-12-27 NOTE — Assessment & Plan Note (Signed)
Ongoing issue.  Pt is attempting to work on healthy diet and regular exercise.  Applauded her efforts.  Check labs to risk stratify.  Will follow.

## 2016-12-28 ENCOUNTER — Encounter: Payer: Self-pay | Admitting: General Practice

## 2017-02-04 ENCOUNTER — Ambulatory Visit (INDEPENDENT_AMBULATORY_CARE_PROVIDER_SITE_OTHER): Payer: 59 | Admitting: Family Medicine

## 2017-02-04 ENCOUNTER — Encounter: Payer: Self-pay | Admitting: Family Medicine

## 2017-02-04 VITALS — BP 142/83 | HR 87 | Temp 99.5°F | Resp 18 | Ht 64.0 in | Wt 193.0 lb

## 2017-02-04 DIAGNOSIS — R6889 Other general symptoms and signs: Secondary | ICD-10-CM | POA: Diagnosis not present

## 2017-02-04 LAB — POC INFLUENZA A&B (BINAX/QUICKVUE)
Influenza A, POC: NEGATIVE
Influenza B, POC: NEGATIVE

## 2017-02-04 MED ORDER — PROMETHAZINE-DM 6.25-15 MG/5ML PO SYRP: 5.0000 mL | ORAL_SOLUTION | Freq: Four times a day (QID) | ORAL | 0 refills | Status: DC | PRN

## 2017-02-04 MED ORDER — BENZONATATE 200 MG PO CAPS: 200.0000 mg | ORAL_CAPSULE | Freq: Two times a day (BID) | ORAL | 0 refills | Status: DC | PRN

## 2017-02-04 NOTE — Patient Instructions (Addendum)
Follow up as needed or as scheduled Your flu test is negative (thankfully!) This appears to be a virus and should improve with time Use the cough syrup for nights and weekends- may cause drowsiness Use the cough pills for daytime cough (you can add Mucinex DM for better cough control) Alternate tylenol and ibuprofen every 4 hrs for body aches and fever Drink plenty of fluids REST! Call with any questions or concerns Hang in there!

## 2017-02-04 NOTE — Progress Notes (Signed)
   Subjective:    Patient ID: Holly Moyer, female    DOB: [birth year], 41 y.o.   MRN: [id number]  HPI URI- sxs started 4 days ago w/ a sore throat.  Friday developed cough and body aches.  Over the weekend developed night sweats, chills, nausea, chest tightness w/ cough.  No known sick contacts.  No sinus pain/pressure.  Bilateral ear pain.  Tm 100.8.   Review of Systems For ROS see HPI     Objective:   Physical Exam  Constitutional: She appears well-developed and well-nourished. No distress.  HENT:  Head: Normocephalic and atraumatic.  TMs normal bilaterally Mild nasal congestion Throat w/out erythema, edema, or exudate  Eyes: Conjunctivae and EOM are normal. Pupils are equal, round, and reactive to light.  Neck: Normal range of motion. Neck supple.  Cardiovascular: Normal rate, regular rhythm, normal heart sounds and intact distal pulses.  No murmur heard. Pulmonary/Chest: Effort normal and breath sounds normal. No respiratory distress. She has no wheezes.  + hacking cough  Lymphadenopathy:    She has no cervical adenopathy.          Assessment & Plan:  Flu like illness- new.  Pt's flu test was negative and no evidence of bacterial infxn.  No need for abx.  Start cough meds.  Reviewed supportive care and red flags that should prompt return.  Pt expressed understanding and is in agreement w/ plan.  Note provided for work.

## 2017-02-05 ENCOUNTER — Encounter: Payer: Self-pay | Admitting: Certified Nurse Midwife

## 2017-05-23 ENCOUNTER — Encounter (HOSPITAL_BASED_OUTPATIENT_CLINIC_OR_DEPARTMENT_OTHER): Payer: Self-pay | Admitting: Emergency Medicine

## 2017-05-23 ENCOUNTER — Emergency Department (HOSPITAL_BASED_OUTPATIENT_CLINIC_OR_DEPARTMENT_OTHER): Payer: 59

## 2017-05-23 ENCOUNTER — Other Ambulatory Visit: Payer: Self-pay

## 2017-05-23 ENCOUNTER — Observation Stay (HOSPITAL_BASED_OUTPATIENT_CLINIC_OR_DEPARTMENT_OTHER)
Admission: EM | Admit: 2017-05-23 | Discharge: 2017-05-24 | Disposition: A | Discharge status: Home / Self Care | Payer: 59 | Attending: Internal Medicine | Admitting: Internal Medicine

## 2017-05-23 DIAGNOSIS — R519 Headache, unspecified: Secondary | ICD-10-CM

## 2017-05-23 DIAGNOSIS — G459 Transient cerebral ischemic attack, unspecified: Secondary | ICD-10-CM | POA: Diagnosis not present

## 2017-05-23 DIAGNOSIS — Z3202 Encounter for pregnancy test, result negative: Secondary | ICD-10-CM | POA: Diagnosis not present

## 2017-05-23 DIAGNOSIS — I10 Essential (primary) hypertension: Secondary | ICD-10-CM | POA: Diagnosis present

## 2017-05-23 DIAGNOSIS — Z79899 Other long term (current) drug therapy: Secondary | ICD-10-CM | POA: Insufficient documentation

## 2017-05-23 DIAGNOSIS — H5340 Unspecified visual field defects: Secondary | ICD-10-CM

## 2017-05-23 DIAGNOSIS — D649 Anemia, unspecified: Secondary | ICD-10-CM | POA: Diagnosis present

## 2017-05-23 DIAGNOSIS — Z7982 Long term (current) use of aspirin: Secondary | ICD-10-CM | POA: Diagnosis not present

## 2017-05-23 DIAGNOSIS — E876 Hypokalemia: Secondary | ICD-10-CM | POA: Diagnosis not present

## 2017-05-23 DIAGNOSIS — R51 Headache: Secondary | ICD-10-CM

## 2017-05-23 DIAGNOSIS — R410 Disorientation, unspecified: Secondary | ICD-10-CM | POA: Diagnosis present

## 2017-05-23 LAB — URINALYSIS, MICROSCOPIC (REFLEX)

## 2017-05-23 LAB — RAPID URINE DRUG SCREEN, HOSP PERFORMED
AMPHETAMINES: NOT DETECTED
BENZODIAZEPINES: NOT DETECTED
Barbiturates: NOT DETECTED
Cocaine: NOT DETECTED
OPIATES: NOT DETECTED
TETRAHYDROCANNABINOL: NOT DETECTED

## 2017-05-23 LAB — COMPREHENSIVE METABOLIC PANEL
ALBUMIN: 4.8 g/dL (ref 3.5–5.0)
ALK PHOS: 57 U/L (ref 38–126)
ALT: 14 U/L (ref 14–54)
ANION GAP: 10 (ref 5–15)
AST: 20 U/L (ref 15–41)
BILIRUBIN TOTAL: 1.2 mg/dL (ref 0.3–1.2)
BUN: 13 mg/dL (ref 6–20)
CALCIUM: 9.6 mg/dL (ref 8.9–10.3)
CO2: 24 mmol/L (ref 22–32)
Chloride: 103 mmol/L (ref 101–111)
Creatinine, Ser: 1.02 mg/dL — ABNORMAL HIGH (ref 0.44–1.00)
GFR calc Af Amer: >60 mL/min (ref >60)
GFR calc non Af Amer: >60 mL/min (ref >60)
GLUCOSE: 95 mg/dL (ref 65–99)
Potassium: 3.3 mmol/L — ABNORMAL LOW (ref 3.5–5.1)
SODIUM: 137 mmol/L (ref 135–145)
Total Protein: 8.8 g/dL — ABNORMAL HIGH (ref 6.5–8.1)

## 2017-05-23 LAB — URINALYSIS, ROUTINE W REFLEX MICROSCOPIC
Bilirubin Urine: NEGATIVE
Glucose, UA: NEGATIVE mg/dL
KETONES UR: NEGATIVE mg/dL
Leukocytes, UA: NEGATIVE
Nitrite: NEGATIVE
PH: 6.5 (ref 5.0–8.0)
Protein, ur: NEGATIVE mg/dL

## 2017-05-23 LAB — DIFFERENTIAL
Basophils Absolute: 0 10*3/uL (ref 0.0–0.1)
Basophils Relative: 0 %
EOS PCT: 0 %
Eosinophils Absolute: 0 10*3/uL (ref 0.0–0.7)
LYMPHS ABS: 2.9 10*3/uL (ref 0.7–4.0)
LYMPHS PCT: 40 %
MONO ABS: 0.6 10*3/uL (ref 0.1–1.0)
MONOS PCT: 8 %
Neutro Abs: 3.7 10*3/uL (ref 1.7–7.7)
Neutrophils Relative %: 52 %

## 2017-05-23 LAB — CBC
HCT: 42.2 % (ref 36.0–46.0)
Hemoglobin: 14.4 g/dL (ref 12.0–15.0)
MCH: 29.3 pg (ref 26.0–34.0)
MCHC: 34.1 g/dL (ref 30.0–36.0)
MCV: 85.9 fL (ref 78.0–100.0)
Platelets: 263 10*3/uL (ref 150–400)
RBC: 4.91 MIL/uL (ref 3.87–5.11)
RDW: 13.6 % (ref 11.5–15.5)
WBC: 7.2 10*3/uL (ref 4.0–10.5)

## 2017-05-23 LAB — PROTIME-INR
INR: 1.02
Prothrombin Time: 13.3 seconds (ref 11.4–15.2)

## 2017-05-23 LAB — PREGNANCY, URINE: Preg Test, Ur: NEGATIVE

## 2017-05-23 LAB — APTT: aPTT: 27 seconds (ref 24–36)

## 2017-05-23 LAB — CBG MONITORING, ED: Glucose-Capillary: 83 mg/dL (ref 65–99)

## 2017-05-23 LAB — TROPONIN I

## 2017-05-23 LAB — ETHANOL

## 2017-05-23 MED ORDER — ACETAMINOPHEN 500 MG PO TABS
1000.0000 mg | ORAL_TABLET | Freq: Once | ORAL | Status: AC
  Administered 2017-05-23: 1000 mg via ORAL
  Filled 2017-05-23: qty 2

## 2017-05-23 MED ORDER — IOPAMIDOL (ISOVUE-370) INJECTION 76%
100.0000 mL | Freq: Once | INTRAVENOUS | Status: AC | PRN
  Administered 2017-05-23: 100 mL via INTRAVENOUS

## 2017-05-23 NOTE — Consult Note (Signed)
Neurology Consultation Reason for Consult: Visual changes Referring Physician: Evangeline Gula, T  CC: Visual change  History is obtained from: Patient  HPI: Holly Moyer is a 42 y.o. female with a history of hypertension who presents with visual change that was transient and complete by confusion earlier today.  She states that she was in her normal state of health when abruptly she lost vision on the right side.  She did not try to see if shutting one eye or the other made a difference.  She notes that when she was looking at a friend, she cannot see her face but could see her body.  She states that the onset was abrupt, without a progressive marching loss of vision.  She denies geometric patterns, lites, colors, or any other positive symptoms.  She denies any numbness or weakness.  She does describe that she was slightly disoriented and confused, having some difficulty with driving home.  She endorses a headache which is located near the top of her head, in the center.  It is not throbbing, not associated with photophobia.  She has had some nausea.  Over the course of 1-2 hours, the symptoms gradually improved and are currently resolved with the exception of mild headache in the center of her head.  Of note, her father died of a heart attack at age 16 and her maternal uncle died of a heart attack at age 24.  She does get headaches from time to time, but has no history of spots in her vision, tingling, or any other neurological symptoms with headaches in the past.  She denies photophobia with previous headaches.   LKW: 1:30 PM tpa given?: no, resolution of symptoms   ROS: A 14 point ROS was performed and is negative except as noted in the HPI.    Past Medical History:  Diagnosis Date  . Allergy   . Anemia   . Breast mass in female 2010   questionable mass, no f/u (fibroadenoma vs papilloma)  . Headache(784.0)   . Hypertension   . Pneumonia    2008  . Transfusion history       Family History  Problem Relation Age of Onset  . Hypertension Father   . Heart disease Father        MI at age 5  . Heart attack Father 40  . Diabetes Maternal Grandmother   . Heart failure Maternal Grandmother   . Ovarian cancer Maternal Grandmother   . Hypertension Paternal Grandmother   . Hypertension Paternal Grandfather   . Breast cancer Maternal Aunt      Social History:  reports that  has never smoked. she has never used smokeless tobacco. She reports that she does not drink alcohol or use drugs.   Exam: Current vital signs: BP 138/82 (BP Location: Left Arm)   Pulse 71   Temp 98.5 F (36.9 C) (Oral)   Resp 16   Ht 5\' 4"  (1.626 m)   Wt 86.2 kg (190 lb)   LMP 05/09/2013 Comment: Spotting only  SpO2 99%   BMI 32.61 kg/m  Vital signs in last 24 hours: Temp:  [98.5 F (36.9 C)-98.6 F (37 C)] 98.5 F (36.9 C) (02/21 1828) Pulse Rate:  [71-94] 71 (02/21 1828) Resp:  [11-19] 16 (02/21 1828) BP: (132-155)/(82-96) 138/82 (02/21 1828) SpO2:  [99 %-100 %] 99 % (02/21 1828) Weight:  [86.2 kg (190 lb)] 86.2 kg (190 lb) (02/21 1417)   Physical Exam  Constitutional: Appears well-developed and well-nourished.  Psych:  Affect appropriate to situation Eyes: No scleral injection HENT: No OP obstrucion Head: Normocephalic.  Cardiovascular: Normal rate and regular rhythm.  Respiratory: Effort normal, non-labored breathing GI: Soft.  No distension. There is no tenderness.  Skin: WDI  Neuro: Mental Status: Patient is awake, alert, oriented to person, place, month, year, and situation. Patient is able to give a clear and coherent history. No signs of aphasia or neglect Cranial Nerves: II: Visual Fields are full. Pupils are equal, round, and reactive to light.   III,IV, VI: EOMI without ptosis or diploplia.  V: Facial sensation is symmetric to temperature VII: Facial movement is symmetric.  VIII: hearing is intact to voice X: Uvula elevates symmetrically XI:  Shoulder shrug is symmetric. XII: tongue is midline without atrophy or fasciculations.  Motor: Tone is normal. Bulk is normal. 5/5 strength was present in all four extremities.  Sensory: Sensation is symmetric to light touch and temperature in the arms and legs. Plantars: Toes are downgoing bilaterally.  Cerebellar: FNF and HKS are intact bilaterally  I have reviewed labs in epic and the results pertinent to this consultation are: CMP-unremarkable CBC-unremarkable  I have reviewed the images obtained: CT head-unremarkable  Impression: 42 year old female with acute onset right visual loss and confusion.  Possibilities include transient ischemic attack versus migraine aura.  In her case, there are no positive symptoms or migration to suggest migraine aura.  In addition, she has a strong family history of vascular events at very young ages on both her mother and father's side.  She does have a history of hypertension as her only other risk factor, but given the family history, lack of positive symptoms, and abrupt onset, I   think that the prudent course would be treating this as transient ischemic attack rather than migraine aura.  This would likely localize to the dominant PCA territory.  Recommendations: 1. HgbA1c, fasting lipid panel 2. MRI of the brain without contrast 3. Frequent neuro checks 4. Echocardiogram 5. Prophylactic therapy-Antiplatelet med: Aspirin - dose 325mg  PO or 300mg  PR 6. Risk factor modification 7. Telemetry monitoring 8. PT consult, OT consult, Speech consult 9. please page stroke NP  Or  PA  Or MD  from 8am -4 pm as this patient will be followed by the stroke team at this point.   You can look them up on [email or web address]      Roland Rack, MD [facility] [phone]  If 7pm- 7am, please page neurology on call as listed in [place].

## 2017-05-23 NOTE — ED Notes (Signed)
Pt on monitor 

## 2017-05-23 NOTE — ED Notes (Signed)
Teleneuro at the bedside. 

## 2017-05-23 NOTE — ED Notes (Signed)
EDP at the bedside-code stroke

## 2017-05-23 NOTE — ED Notes (Signed)
MD aware of the patients symptoms

## 2017-05-23 NOTE — ED Provider Notes (Addendum)
[facility] EMERGENCY DEPARTMENT Provider Note   CSN: [id number] Arrival date & time: 05/23/17  1402     History   Chief Complaint Chief Complaint  Patient presents with  . Altered Mental Status    HPI Holly Moyer is a 42 y.o. female.  HPI   42 year old female with a history of hypertension, presents with concern for acute onset of confusion, headache and visual changes.  Patient reports that she was at [place], and suddenly developed a headache, and visual changes.  Reports she could not see out of her right side.  She could not see out of the right peripheral side of her vision.  Reports that she drove back to work, but she felt confused as far as where she was going, and had a difficult time seeing the turns, and had a difficult time parking due to difficulty seeing on the right side.  Reports that when she got to work, she could not see her coworkers face to these visual changes.  Reports she had a headache located at the top of her head, and rated 3 out of 10 in severity.  Denies nausea, vomiting, numbness, weakness, difficulty talking or difficulty walking.  She has never had symptoms like this before.  Reports that she does not have a history of migraines.  Reports she has a history of hypertension but denies any other medical problems.  Past Medical History:  Diagnosis Date  . Allergy   . Anemia   . Breast mass in female 2010   questionable mass, no f/u (fibroadenoma vs papilloma)  . Headache(784.0)   . Hypertension   . Pneumonia    2008  . Transfusion history     Patient Active Problem List   Diagnosis Date Noted  . TIA (transient ischemic attack) 05/23/2017  . Physical exam 12/27/2016  . HTN (hypertension) 06/27/2016  . Obesity (BMI 30.0-34.9) 06/27/2016  . S/P hysterectomy 05/13/2013  . Menorrhagia 02/19/2013  . Chest pain 02/19/2013  . DOE (dyspnea on exertion) 02/19/2013  . MICROSCOPIC HEMATURIA 02/10/2009  . ACUTE CYSTITIS 12/31/2008  .  BREAST MASS, RIGHT 06/30/2008  . OTHER ABSCESS OF VULVA 05/13/2008  . BRUISE 05/13/2008  . ANEMIA 01/16/2008    Past Surgical History:  Procedure Laterality Date  . ABDOMINAL HYSTERECTOMY  2015  . BILATERAL SALPINGECTOMY Bilateral 05/13/2013   Procedure: BILATERAL SALPINGECTOMY;  Surgeon: Azalia Bilis, MD;  Location: [facility];  Service: Gynecology;  Laterality: Bilateral;  . CESAREAN SECTION     x's 2  . COLPOSCOPY  2004  . CYSTO N/A 05/13/2013   Procedure: CYSTO;  Surgeon: Azalia Bilis, MD;  Location: [facility];  Service: Gynecology;  Laterality: N/A;  . SUPRACERVICAL ABDOMINAL HYSTERECTOMY N/A 05/13/2013   Procedure: HYSTERECTOMY SUPRACERVICAL ABDOMINAL;  Surgeon: Azalia Bilis, MD;  Location: [facility];  Service: Gynecology;  Laterality: N/A;  . WISDOM TOOTH EXTRACTION      OB History    Gravida Para Term Preterm AB Living   3 3   1   2    SAB TAB Ectopic Multiple Live Births           1       Home Medications    Prior to Admission medications   Medication Sig Start Date End Date Taking? Authorizing Provider  aspirin EC 81 MG tablet Take 81 mg by mouth daily.    [provider]  benzonatate (TESSALON) 200 MG capsule Take 1 capsule (200 mg total) 2 (two) times daily  as needed by mouth for cough. 02/04/17   Midge Minium, MD  fexofenadine (ALLEGRA) 180 MG tablet Take 180 mg by mouth daily.    [provider]  GARLIC PO Take 809 mg by mouth daily.     [provider]  ibuprofen (ADVIL,MOTRIN) 400 MG tablet Take 800 mg by mouth every 6 (six) hours as needed for moderate pain (pain).     [provider]  Multiple Vitamin (MULTIVITAMIN) capsule Take 1 capsule by mouth daily.    [provider]  promethazine-dextromethorphan (PROMETHAZINE-DM) 6.25-15 MG/5ML syrup Take 5 mLs 4 (four) times daily as needed by mouth. 02/04/17   Midge Minium, MD    Family History Family History  Problem Relation Age of Onset  . Hypertension  Father   . Heart disease Father        MI at age 66  . Heart attack Father 53  . Diabetes Maternal Grandmother   . Heart failure Maternal Grandmother   . Ovarian cancer Maternal Grandmother   . Hypertension Paternal Grandmother   . Hypertension Paternal Grandfather   . Breast cancer Maternal Aunt     Social History Social History   Tobacco Use  . Smoking status: Never Smoker  . Smokeless tobacco: Never Used  Substance Use Topics  . Alcohol use: No  . Drug use: No     Allergies   Macrobid [nitrofurantoin macrocrystal]   Review of Systems Review of Systems  Constitutional: Negative for fever.  HENT: Negative for sore throat.   Eyes: Positive for visual disturbance.  Respiratory: Negative for cough and shortness of breath.   Cardiovascular: Negative for chest pain.  Gastrointestinal: Negative for abdominal pain.  Genitourinary: Negative for difficulty urinating.  Musculoskeletal: Negative for back pain and neck pain.  Skin: Negative for rash.  Neurological: Positive for headaches. Negative for syncope, facial asymmetry, speech difficulty, weakness and numbness.     Physical Exam Updated Vital Signs BP (!) 144/91   Pulse 75   Temp 98.6 F (37 C)   Resp 11   Ht 5\' 4"  (1.626 m)   Wt 86.2 kg (190 lb)   LMP 05/09/2013 Comment: Spotting only  SpO2 100%   BMI 32.61 kg/m   Physical Exam  Constitutional: She is oriented to person, place, and time. She appears well-developed and well-nourished. No distress.  HENT:  Head: Normocephalic and atraumatic.  Eyes: Conjunctivae and EOM are normal.  Neck: Normal range of motion.  Cardiovascular: Normal rate, regular rhythm, normal heart sounds and intact distal pulses. Exam reveals no gallop and no friction rub.  No murmur heard. Pulmonary/Chest: Effort normal and breath sounds normal. No respiratory distress. She has no wheezes. She has no rales.  Abdominal: Soft. She exhibits no distension. There is no tenderness. There  is no guarding.  Musculoskeletal: She exhibits no edema or tenderness.  Neurological: She is alert and oriented to person, place, and time. She has normal strength. No cranial nerve deficit or sensory deficit. Coordination normal. GCS eye subscore is 4. GCS verbal subscore is 5. GCS motor subscore is 6.  Right visual field deficit  Skin: Skin is warm and dry. No rash noted. She is not diaphoretic. No erythema.  Nursing note and vitals reviewed.    ED Treatments / Results  Labs (all labs ordered are listed, but only abnormal results are displayed) Labs Reviewed  COMPREHENSIVE METABOLIC PANEL - Abnormal; Notable for the following components:      Result Value   Potassium  3.3 (*)    Creatinine, Ser 1.02 (*)    Total Protein 8.8 (*)    All other components within normal limits  URINALYSIS, ROUTINE W REFLEX MICROSCOPIC - Abnormal; Notable for the following components:   Specific Gravity, Urine <1.005 (*)    Hgb urine dipstick SMALL (*)    All other components within normal limits  URINALYSIS, MICROSCOPIC (REFLEX) - Abnormal; Notable for the following components:   Bacteria, UA RARE (*)    Squamous Epithelial / LPF 0-5 (*)    All other components within normal limits  CBC  ETHANOL  PROTIME-INR  APTT  TROPONIN I  RAPID URINE DRUG SCREEN, HOSP PERFORMED  PREGNANCY, URINE  DIFFERENTIAL  CBG MONITORING, ED  CBG MONITORING, ED    EKG  EKG Interpretation None       Radiology Ct Angio Head W Or Wo Contrast  Result Date: 05/23/2017 CLINICAL DATA:  Arterial stricture or occlusion. Right eye visual disturbance EXAM: CT ANGIOGRAPHY HEAD AND NECK TECHNIQUE: Multidetector CT imaging of the head and neck was performed using the standard protocol during bolus administration of intravenous contrast. Multiplanar CT image reconstructions and MIPs were obtained to evaluate the vascular anatomy. Carotid stenosis measurements (when applicable) are obtained utilizing NASCET criteria, using the  distal internal carotid diameter as the denominator. CONTRAST:  163mL ISOVUE-370 IOPAMIDOL (ISOVUE-370) INJECTION 76% COMPARISON:  CT head 05/18/2014 FINDINGS: CT HEAD FINDINGS Brain: No evidence of acute infarction, hemorrhage, hydrocephalus, extra-axial collection or mass lesion/mass effect. Vascular: Negative for hyperdense vessel Skull: Negative Sinuses: Negative Orbits: Negative Review of the MIP images confirms the above findings CTA NECK FINDINGS Aortic arch: Normal aortic arch and proximal great vessels Right carotid system: Normal right carotid without stenosis or dissection. No atherosclerotic disease Left carotid system: Normal left carotid without stenosis or dissection. Negative for atherosclerotic disease. Vertebral arteries: Both vertebral arteries are widely patent and normal. Skeleton: Negative Other neck: Negative Upper chest: Lung apices clear bilaterally. Review of the MIP images confirms the above findings CTA HEAD FINDINGS Anterior circulation: Cavernous carotid normal bilaterally. Anterior and middle cerebral arteries normal bilaterally. Posterior circulation: Both vertebral arteries patent to the basilar. Basilar normal. PICA, superior cerebellar, and posterior cerebral arteries normal bilaterally. No stenosis or occlusion. Venous sinuses: Negative Anatomic variants: None Delayed phase: Normal enhancement on delayed imaging. Review of the MIP images confirms the above findings IMPRESSION: Negative CTA head and neck These results were called by telephone at the time of interpretation on 05/23/2017 at 3:19 pm to Dr. Gareth Morgan , who verbally acknowledged these results. Electronically Signed   By: Franchot Gallo M.D.   On: 05/23/2017 15:19   Ct Angio Neck W And/or Wo Contrast  Result Date: 05/23/2017 CLINICAL DATA:  Arterial stricture or occlusion. Right eye visual disturbance EXAM: CT ANGIOGRAPHY HEAD AND NECK TECHNIQUE: Multidetector CT imaging of the head and neck was performed using  the standard protocol during bolus administration of intravenous contrast. Multiplanar CT image reconstructions and MIPs were obtained to evaluate the vascular anatomy. Carotid stenosis measurements (when applicable) are obtained utilizing NASCET criteria, using the distal internal carotid diameter as the denominator. CONTRAST:  175mL ISOVUE-370 IOPAMIDOL (ISOVUE-370) INJECTION 76% COMPARISON:  CT head 05/18/2014 FINDINGS: CT HEAD FINDINGS Brain: No evidence of acute infarction, hemorrhage, hydrocephalus, extra-axial collection or mass lesion/mass effect. Vascular: Negative for hyperdense vessel Skull: Negative Sinuses: Negative Orbits: Negative Review of the MIP images confirms the above findings CTA NECK FINDINGS Aortic arch: Normal aortic arch and proximal great vessels  Right carotid system: Normal right carotid without stenosis or dissection. No atherosclerotic disease Left carotid system: Normal left carotid without stenosis or dissection. Negative for atherosclerotic disease. Vertebral arteries: Both vertebral arteries are widely patent and normal. Skeleton: Negative Other neck: Negative Upper chest: Lung apices clear bilaterally. Review of the MIP images confirms the above findings CTA HEAD FINDINGS Anterior circulation: Cavernous carotid normal bilaterally. Anterior and middle cerebral arteries normal bilaterally. Posterior circulation: Both vertebral arteries patent to the basilar. Basilar normal. PICA, superior cerebellar, and posterior cerebral arteries normal bilaterally. No stenosis or occlusion. Venous sinuses: Negative Anatomic variants: None Delayed phase: Normal enhancement on delayed imaging. Review of the MIP images confirms the above findings IMPRESSION: Negative CTA head and neck These results were called by telephone at the time of interpretation on 05/23/2017 at 3:19 pm to Dr. Gareth Morgan , who verbally acknowledged these results. Electronically Signed   By: Franchot Gallo M.D.   On:  05/23/2017 15:19    Procedures .Critical Care Performed by: Gareth Morgan, MD Authorized by: Gareth Morgan, MD   Critical care provider statement:    Critical care time (minutes):  30   Critical care was necessary to treat or prevent imminent or life-threatening deterioration of the following conditions:  CNS failure or compromise   Critical care was time spent personally by me on the following activities:  Discussions with consultants, examination of patient, ordering and review of laboratory studies, ordering and review of radiographic studies and re-evaluation of patient's condition   (including critical care time)  Medications Ordered in ED Medications  acetaminophen (TYLENOL) tablet 1,000 mg (not administered)  iopamidol (ISOVUE-370) 76 % injection 100 mL (100 mLs Intravenous Contrast Given 05/23/17 1452)     Initial Impression / Assessment and Plan / ED Course  I have reviewed the triage vital signs and the nursing notes.  Pertinent labs & imaging results that were available during my care of the patient were reviewed by me and considered in my medical decision making (see chart for details).      42 year old female with a history of hypertension, presents with concern for acute onset of confusion, headache and visual changes.  Code stroke not initiated on arrival as symptoms described to me were patient's self-described confusion without other neurologic deficits, however when I went to evaluate her we discovered that her difficulty driving home, parking, and some of the confusion she described was associated with change in the right side of her vision.  On my initial exam, she had a significant right visual field cut, and could not see fingers until they were approximately near midline.  At this time, a code stroke was called and she was taken to CT for CT angios head and neck.  CT angio evaluated by me and radiology and shows no sign of large vessel occlusion or other  abnormalities on CT head.  Telemetry neurology evaluated patient.  After she had returned from CT scan, her symptoms are improving, and both on my reevaluation and on neurology's evaluation, she is noted to have resolution of symptoms.  Given improvement of symptoms, TPA is not indicated.  Differential diagnosis includes TIA, stroke, complicated migraine or other.    Discussed with tele-neurologist on call who had evaluated patient and recommended inpatient TIA/CVA work up.  Discussed with hospitalist Dr. Evangeline Gula and will admit to [facility] telemetry.  [facility], Dr. Rory Percy aware of patient's pending transfer and she has been added to Neurology's list.   Final Clinical Impressions(s) /  ED Diagnoses   Final diagnoses:  Visual field defect  Acute nonintractable headache, unspecified headache type  TIA (transient ischemic attack)    ED Discharge Orders    None        Gareth Morgan, MD 05/23/17 1719

## 2017-05-23 NOTE — Consult Note (Signed)
   [facility] Tele[facility]urology Consult Services  Impression: Visual changes, Stroke vs migraine The clinical syndrome is more likely to be migraine with aura given her age and series of events but since she has never had neurological symptoms associated with any of her headaches, need to evaluate for stroke.  Not a tpa candidate due to: symptoms resolved, nihss 0 Not an NIR candidate due to: no LVO on CTA  Comments:   [facility] contacted: [phone] [facility] at bedside: 1455 NIHSS assessment time: 1500  Recommendations:  Stroke protocol evaluation Antiplatelet Tele Further inpatient evaluation as per Neurology/ Internal Medicine Discussed with ED MD  History of Present Illness   Patient is a 42 yo F with h/o HTN and occasional headaches pw confusion, and visual changes.  She noted symtpoms started aroun 130 when she felt confused and did not know where she was eating lunch. Then then attempted to drive to work and felt she was lost. When she arrived, she had trouble parking and then noted that she could not appropriately see a [employer]ud[employer].  She c/o right VF loss in that she could not see her co-worker's head. She could see only shoulders. She now has developed a headache as well.  Since her arrival to the ED, her VFD has resolved. Both eyes were tested extensively and there is no longer a field deficit. Reading is normal as well.   Exam   NIHSS score:0 1A: Level of Consciousness - Alert; keenly responsive  1B: Ask Month and Age - Both Questions Right  1C: 'Blink Eyes' & 'Squeeze Hands' - Performs Both Tasks  2: Test Horizontal Extraocular Movements - Normal  3: Test Visual Fields - No Visual Loss  4: Test Facial Palsy - Normal symmetry 5A: Test Left Arm Motor Drift - No Drift for 10 Seconds  5B: Test Right Arm Motor Drift - No Drift for 10 Seconds 6A: Test Left Leg Motor Drift - No Drift for 5 Seconds  6B: Test Right Leg Motor Drift - No Drift for 5 Seconds  7: Test  Limb Ataxia - No Ataxia  8: Test Sensation - Normal; No sensory loss  9: Test Language/Aphasia - Normal; No aphasia  10: Test Dysarthria - Normal  11: Test Extinction/Inattention - No abnormality   Medical Decision Making:  - Extensive number of diagnosis or management options are considered above.   - Extensive amount of complex data reviewed.   - High risk of complication and/or morbidity or mortality are associated with differential diagnostic considerations above.  - There may be Uncertain outcome and increased probability of prolonged functional impairment or high probability of severe prolonged functional impairment associated with some of these differential diagnosis.  Medical Data Reviewed:  1.Data reviewed include clinical labs, radiology,  Medical Tests;   2.Tests results discussed w/performing or interpreting physician;   3.Obtaining/reviewing old medical records;  4.Obtaining case history from another source;  5.Independent review of image, tracing or specimen.

## 2017-05-23 NOTE — ED Notes (Signed)
RN Rosana Hoes did quick stroke scale on Pt. At time of registration on Pt. With Pt. Having all WNL except her complaint of her R eye with a visual disturbance in the R eye to the side of the R eye.  RN Rosana Hoes Reported to the Triage RN Keli C

## 2017-05-23 NOTE — ED Triage Notes (Signed)
Patient states that she was was in line at [employer] at [employer] and she started t "feel weird" - THe patient states that she did not recognize how to get out of the parking lot and having to "guess her way back to work"

## 2017-05-23 NOTE — ED Notes (Signed)
Returns from [facility] with this RN.  Neurologist on at the bedside.

## 2017-05-24 ENCOUNTER — Observation Stay (HOSPITAL_BASED_OUTPATIENT_CLINIC_OR_DEPARTMENT_OTHER): Payer: 59

## 2017-05-24 ENCOUNTER — Observation Stay (HOSPITAL_COMMUNITY): Payer: 59

## 2017-05-24 DIAGNOSIS — G459 Transient cerebral ischemic attack, unspecified: Secondary | ICD-10-CM | POA: Diagnosis not present

## 2017-05-24 DIAGNOSIS — E876 Hypokalemia: Secondary | ICD-10-CM

## 2017-05-24 LAB — ECHOCARDIOGRAM COMPLETE
Height: 64 in
Weight: 3040 oz

## 2017-05-24 LAB — SEDIMENTATION RATE: Sed Rate: 5 mm/hr (ref 0–22)

## 2017-05-24 LAB — LIPID PANEL
CHOL/HDL RATIO: 4.1 ratio
CHOLESTEROL: 195 mg/dL (ref 0–200)
HDL: 48 mg/dL (ref >40)
LDL Cholesterol: 133 mg/dL — ABNORMAL HIGH (ref 0–99)
Triglycerides: 71 mg/dL (ref <150)
VLDL: 14 mg/dL (ref 0–40)

## 2017-05-24 LAB — HEMOGLOBIN A1C
HEMOGLOBIN A1C: 5.7 % — AB (ref 4.8–5.6)
Mean Plasma Glucose: 116.89 mg/dL

## 2017-05-24 LAB — HIV ANTIBODY (ROUTINE TESTING W REFLEX): HIV Screen 4th Generation wRfx: NONREACTIVE

## 2017-05-24 MED ORDER — ZOLPIDEM TARTRATE 5 MG PO TABS: 5.0000 mg | ORAL_TABLET | Freq: Every evening | ORAL | Status: DC | PRN

## 2017-05-24 MED ORDER — POTASSIUM CHLORIDE CRYS ER 20 MEQ PO TBCR
40.0000 meq | EXTENDED_RELEASE_TABLET | Freq: Two times a day (BID) | ORAL | Status: DC
  Administered 2017-05-24: 40 meq via ORAL
  Filled 2017-05-24: qty 2

## 2017-05-24 MED ORDER — BENZONATATE 100 MG PO CAPS: 200.0000 mg | ORAL_CAPSULE | Freq: Two times a day (BID) | ORAL | Status: DC | PRN

## 2017-05-24 MED ORDER — LORATADINE 10 MG PO TABS
10.0000 mg | ORAL_TABLET | Freq: Every day | ORAL | Status: DC
  Administered 2017-05-24: 10 mg via ORAL
  Filled 2017-05-24: qty 1

## 2017-05-24 MED ORDER — STROKE: EARLY STAGES OF RECOVERY BOOK
Freq: Once | Status: AC
  Administered 2017-05-24: 05:00:00

## 2017-05-24 MED ORDER — ASPIRIN 300 MG RE SUPP: 300.0000 mg | Freq: Every day | RECTAL | Status: DC

## 2017-05-24 MED ORDER — SODIUM CHLORIDE 0.9 % IV SOLN
INTRAVENOUS | Status: DC
  Administered 2017-05-24: 05:00:00 via INTRAVENOUS

## 2017-05-24 MED ORDER — ACETAMINOPHEN 325 MG PO TABS
650.0000 mg | ORAL_TABLET | ORAL | Status: DC | PRN
  Administered 2017-05-24 (×2): 650 mg via ORAL
  Filled 2017-05-24 (×2): qty 2

## 2017-05-24 MED ORDER — GARLIC 500 MG PO TABS: 500.0000 mg | ORAL_TABLET | Freq: Every day | ORAL | Status: DC

## 2017-05-24 MED ORDER — ATORVASTATIN CALCIUM 40 MG PO TABS: 40.0000 mg | ORAL_TABLET | Freq: Every day | ORAL | 0 refills | Status: DC

## 2017-05-24 MED ORDER — ENOXAPARIN SODIUM 40 MG/0.4ML ~~LOC~~ SOLN
40.0000 mg | SUBCUTANEOUS | Status: DC
  Filled 2017-05-24: qty 0.4

## 2017-05-24 MED ORDER — HYDRALAZINE HCL 20 MG/ML IJ SOLN: 5.0000 mg | INTRAMUSCULAR | Status: DC | PRN

## 2017-05-24 MED ORDER — ACETAMINOPHEN 160 MG/5ML PO SOLN: 650.0000 mg | ORAL | Status: DC | PRN

## 2017-05-24 MED ORDER — ATORVASTATIN CALCIUM 40 MG PO TABS
40.0000 mg | ORAL_TABLET | Freq: Every day | ORAL | Status: DC
  Administered 2017-05-24: 40 mg via ORAL
  Filled 2017-05-24: qty 1

## 2017-05-24 MED ORDER — POLYETHYLENE GLYCOL 3350 17 G PO PACK: 17.0000 g | PACK | Freq: Every day | ORAL | Status: DC | PRN

## 2017-05-24 MED ORDER — ASPIRIN 325 MG PO TABS
325.0000 mg | ORAL_TABLET | Freq: Every day | ORAL | Status: DC
  Administered 2017-05-24: 325 mg via ORAL
  Filled 2017-05-24: qty 1

## 2017-05-24 MED ORDER — PROSIGHT PO TABS
1.0000 | ORAL_TABLET | Freq: Every day | ORAL | Status: DC
  Administered 2017-05-24: 1 via ORAL
  Filled 2017-05-24: qty 1

## 2017-05-24 MED ORDER — STROKE: EARLY STAGES OF RECOVERY BOOK
Freq: Once | Status: AC
  Administered 2017-05-24: 09:00:00

## 2017-05-24 MED ORDER — POTASSIUM CHLORIDE 20 MEQ/15ML (10%) PO SOLN
20.0000 meq | Freq: Once | ORAL | Status: AC
  Administered 2017-05-24: 20 meq via ORAL
  Filled 2017-05-24: qty 15

## 2017-05-24 MED ORDER — SENNOSIDES-DOCUSATE SODIUM 8.6-50 MG PO TABS: 1.0000 | ORAL_TABLET | Freq: Every evening | ORAL | Status: DC | PRN

## 2017-05-24 MED ORDER — ONDANSETRON HCL 4 MG/2ML IJ SOLN: 4.0000 mg | Freq: Three times a day (TID) | INTRAMUSCULAR | Status: DC | PRN

## 2017-05-24 MED ORDER — ACETAMINOPHEN 650 MG RE SUPP: 650.0000 mg | RECTAL | Status: DC | PRN

## 2017-05-24 NOTE — Progress Notes (Signed)
Patient discharged home. Discharge instructions were reviewed with patient. Patient verbalized understanding.  

## 2017-05-24 NOTE — Evaluation (Signed)
Physical Therapy Evaluation Patient Details Name: Holly Moyer MRN: [id number] DOB: [birth year] Today's Date: 05/24/2017   History of Present Illness  42 year old lady with past medical history of hypertension, who presents with transient confusion and right eye vision loss. No unilateral weakness or numbness in extremities. Neurology was consulted. Dr. Leonel Ramsay recommended TIA workup.  Clinical Impression  Pt reports her visual changes have improved since admittance. Pt is currently admitted for TIA and  is currently Independent with all mobility. Pt does not present with any balance or strength deficits. Pt is safe to ambulate on the unit until d/c home and does not have any further PT needs. Pt is D/C from PT services and pt is agreeable with plan.    Follow Up Recommendations No PT follow up    Equipment Recommendations  None recommended by PT    Recommendations for Other Services       Precautions / Restrictions Precautions Precautions: None Restrictions Weight Bearing Restrictions: No      Mobility  Bed Mobility Overal bed mobility: Independent                Transfers Overall transfer level: Independent Equipment used: None                Ambulation/Gait Ambulation/Gait assistance: Independent Ambulation Distance (Feet): 200 Feet Assistive device: None Gait Pattern/deviations: WFL(Within Functional Limits)   Gait velocity interpretation: >2.62 ft/sec, indicative of independent Tourist information centre manager Rankin (Stroke Patients Only) Modified Rankin (Stroke Patients Only) Pre-Morbid Rankin Score: No symptoms Modified Rankin: No symptoms     Balance Overall balance assessment: Independent                               Standardized Balance Assessment Standardized Balance Assessment : Dynamic Gait Index   Dynamic Gait Index Level Surface: Normal Change in Gait Speed:  Normal Gait with Horizontal Head Turns: Normal Gait with Vertical Head Turns: Normal Gait and Pivot Turn: Normal Step Over Obstacle: Normal Step Around Obstacles: Normal Steps: Normal Total Score: 24       Pertinent Vitals/Pain Pain Assessment: No/denies pain    Home Living Family/patient expects to be discharged to:: Private residence Living Arrangements: Children;Parent Available Help at Discharge: Family Type of Home: House         Home Equipment: None      Prior Function                 Hand Dominance        Extremity/Trunk Assessment   Upper Extremity Assessment Upper Extremity Assessment: Defer to OT evaluation    Lower Extremity Assessment Lower Extremity Assessment: Overall WFL for tasks assessed    Cervical / Trunk Assessment Cervical / Trunk Assessment: Normal  Communication   Communication: No difficulties  Cognition Arousal/Alertness: Awake/alert Behavior During Therapy: WFL for tasks assessed/performed Overall Cognitive Status: Within Functional Limits for tasks assessed                                 General Comments: Pt reports visual deficits have resolved.      General Comments      Exercises     Assessment/Plan    PT Assessment Patent does not need any further PT services  PT Problem  List         PT Treatment Interventions      PT Goals (Current goals can be found in the Care Plan section)  Acute Rehab PT Goals Patient Stated Goal: to go home.    Frequency     Barriers to discharge        Co-evaluation               AM-PAC PT "6 Clicks" Daily Activity  Outcome Measure Difficulty turning over in bed (including adjusting bedclothes, sheets and blankets)?: None Difficulty moving from lying on back to sitting on the side of the bed? : None Difficulty sitting down on and standing up from a chair with arms (e.g., wheelchair, bedside commode, etc,.)?: None Help needed moving to and from a bed  to chair (including a wheelchair)?: None Help needed walking in hospital room?: None Help needed climbing 3-5 steps with a railing? : None 6 Click Score: 24    End of Session   Activity Tolerance: Patient tolerated treatment well Patient left: in bed;with family/visitor present Nurse Communication: Mobility status(Pt is Independent)      Time: 1219-7588 PT Time Calculation (min) (ACUTE ONLY): 15 min   Charges:   PT Evaluation $PT Eval Low Complexity: 1 Low     PT G Codes:       Theodoro Grist, PT  Caydn Justen Kerstine 05/24/2017, 9:25 AM

## 2017-05-24 NOTE — Progress Notes (Signed)
EEG complete - results pending 

## 2017-05-24 NOTE — Procedures (Signed)
EEG Report  Clinical History:  Visual scotoma in one eye and associated confusion lasting about one hour.  Mild headache afterwards.    Technical Summary:  A 19 channel digital EEG recording was performed using the 10-20 international system of electrode placement.  Bipolar and Referential montages were used.  The total recording time was approx 20 minutes.  Findings:  There is a posterior dominant rhythm of 11-12 Hz reactive to eye opening and closure.  No focal slowing is present.  Photic stimulation produces a symmetrical driving response.  Hyperventilation is not performed. There are epileptiform discharge on 3 occasions with maximal negativity at [facility] and consistently seen on all montages well.   No electrographic seizures.  Sleep is not recorded.  Impression:  This is an abnormal EEG.  There is evidence of a seizure tendency emanating from the right frontal lobe.    Rogue Jury, MS, MD

## 2017-05-24 NOTE — Progress Notes (Signed)
Pt alert and oriented x4, no complaints of pain or discomfort.  Bed in low position, call bell within reach.  Bed alarms on and functioning.  Assessment done and charted.  Will continue to monitor and do hourly rounding throughout the shift 

## 2017-05-24 NOTE — Progress Notes (Signed)
OT Cancellation Note  Patient Details Name: Holly Moyer MRN: [id number] DOB: 1976-01-18   Cancelled Treatment:    Reason Eval/Treat Not Completed: Patient at procedure or test/ unavailable(EEG)  [name] 05/24/2017, 3:54 PM

## 2017-05-24 NOTE — Progress Notes (Signed)
Pt gone down to MRI and ECHO.

## 2017-05-24 NOTE — Progress Notes (Addendum)
NEUROHOSPITALISTS STROKE TEAM - DAILY PROGRESS NOTE   ADMISSION HISTORY:  Holly Moyer is a 42 y.o. female with a history of hypertension who presents with visual change that was transient and complete by confusion earlier today.  She states that she was in her normal state of health when abruptly she lost vision on the right side.  She did not try to see if shutting one eye or the other made a difference.  She notes that when she was looking at a friend, she cannot see her face but could see her body.  She states that the onset was abrupt, without a progressive marching loss of vision.    She denies geometric patterns, lites, colors, or any other positive symptoms.  She denies any numbness or weakness.  She does describe that she was slightly disoriented and confused, having some difficulty with driving home.     She endorses a headache which is located near the top of her head, in the center.  It is not throbbing, not associated with photophobia.  She has had some nausea.        Over the course of 1-2 hours, the symptoms gradually improved and are currently resolved with the exception of mild headache in the center of her head.      Of note, her father died of a heart attack at age 70 and her maternal uncle died of a heart attack at age 5.       She does get headaches from time to time, but has no history of spots in her vision, tingling, or any other neurological symptoms with headaches in the past.  She denies photophobia with previous headaches.  LKW: 1:30 PM tpa given?: no, resolution of symptoms  SUBJECTIVE (INTERVAL HISTORY) Family is at the bedside. Patient is found laying in bed in NAD. Overall she feels her condition is completely resolved. Voices no new complaints. No new events reported overnight. Patient denies any history of DVT/PE, Denies any current hormone therapy. Has history of one miscarriage a few years ago.   OBJECTIVE Lab  Results: CBC:  Recent Labs  Lab 05/23/17 1429  WBC 7.2  HGB 14.4  HCT 42.2  MCV 85.9  PLT 263   BMP: Recent Labs  Lab 05/23/17 1429  NA 137  K 3.3*  CL 103  CO2 24  GLUCOSE 95  BUN 13  CREATININE 1.02*  CALCIUM 9.6   Liver Function Tests:  Recent Labs  Lab 05/23/17 1429  AST 20  ALT 14  ALKPHOS 57  BILITOT 1.2  PROT 8.8*  ALBUMIN 4.8   Cardiac Enzymes:  Recent Labs  Lab 05/23/17 1429  TROPONINI <0.03   Coagulation Studies:  Recent Labs    05/23/17 1524  APTT 27  INR 1.02   Urinalysis:  Recent Labs  Lab 05/23/17 1524  COLORURINE YELLOW  APPEARANCEUR CLEAR  LABSPEC <1.005*  PHURINE 6.5  GLUCOSEU NEGATIVE  HGBUR SMALL*  BILIRUBINUR NEGATIVE  KETONESUR NEGATIVE  PROTEINUR NEGATIVE  NITRITE NEGATIVE  LEUKOCYTESUR NEGATIVE   Urine Drug Screen:     Component Value Date/Time   LABOPIA NONE DETECTED 05/23/2017 1524   COCAINSCRNUR NONE DETECTED 05/23/2017 1524   LABBENZ NONE DETECTED 05/23/2017 1524   AMPHETMU NONE DETECTED 05/23/2017 1524   THCU NONE DETECTED 05/23/2017 1524   LABBARB NONE DETECTED 05/23/2017 1524    Alcohol Level:  Recent Labs  Lab 05/23/17 1430  ETH <10   PHYSICAL EXAM Temp:  [98.2 F (36.8 C)-98.9 F (  37.2 C)] 98.4 F (36.9 C) (02/22 0751) Pulse Rate:  [64-94] 79 (02/22 0751) Resp:  [11-19] 17 (02/22 0751) BP: (125-155)/(74-96) 134/79 (02/22 0751) SpO2:  [98 %-100 %] 98 % (02/22 0751) Weight:  [86.2 kg (190 lb)] 86.2 kg (190 lb) (02/21 1417) General - Well nourished, well developed, in no apparent distress HEENT-  Normocephalic,  Cardiovascular - Regular rate and rhythm  Respiratory - Lungs clear bilaterally. No wheezing. Abdomen - soft and non-tender, BS normal Extremities- no edema or cyanosis Mental Status: Patient is awake, alert, oriented to person, place, month, year, and situation. Patient is able to give a clear and coherent history. No signs of aphasia or neglect Cranial Nerves: II: Visual Fields  are full. Pupils are equal, round, and reactive to light.   III,IV, VI: EOMI without ptosis or diploplia.  V: Facial sensation is symmetric to temperature VII: Facial movement is symmetric.  VIII: hearing is intact to voice X: Uvula elevates symmetrically XI: Shoulder shrug is symmetric. XII: tongue is midline without atrophy or fasciculations.  Motor: Tone is normal. Bulk is normal. 5/5 strength was present in all four extremities.  Sensory: Sensation is symmetric to light touch and temperature in the arms and legs. Plantars: Toes are downgoing bilaterally.  Cerebellar: FNF and HKS are intact bilaterally  IMAGING: I have personally reviewed the radiological images below and agree with the radiology interpretations.  Ct Angio Head W Or Wo Contrast Ct Angio Head/ Neck W And/or Wo Contrast Result Date: 05/23/2017 IMPRESSION: Negative CTA head and neck These results were called by telephone at the time of interpretation on 05/23/2017 at 3:19 pm to Dr. Gareth Morgan , who verbally acknowledged these results. Electronically Signed   By: Franchot Gallo M.D.   On: 05/23/2017 15:19   Echocardiogram:                                              PENDING MRI Brain:                                                         PENDING     IMPRESSION: [name] is a 42 y.o. 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    Likely TIA  Suspected Etiology: small vessel disease Resultant Symptoms:  right visual loss and confusion.   Stroke Risk Factors: hypertension Other Stroke Risk Factors: Obesity, Body mass index is 32.61 kg/m.   Outstanding Stroke Work-up Studies:     Echocardiogram:                                                    PENDING MRI Brain:  PENDING   PLAN  05/24/2017: Continue Aspirin/ Statin Frequent neuro checks Telemetry monitoring PT/OT/SLP Ongoing  aggressive stroke risk factor management Patient counseled to be compliant with her antithrombotic medications Patient counseled on Lifestyle modifications including, Diet, Exercise, and Stress Follow up with [facility] in 6 weeks  HYPERTENSION: Stable Permissive hypertension (OK if <220/120) for 24-48 hours post stroke and then gradually normalized within 5-7 days. Long term BP goal normotensive. May slowly start B/P medications after 48 hours, if necessary Home Meds: NONE  HYPERLIPIDEMIA:    Component Value Date/Time   CHOL 195 05/24/2017 0708   TRIG 71 05/24/2017 0708   HDL 48 05/24/2017 0708   CHOLHDL 4.1 05/24/2017 0708   VLDL 14 05/24/2017 0708   LDLCALC 133 (H) 05/24/2017 0708   Home Meds:  NONE LDL  goal < 70 Started on  Lipitor to 40 mg daily Continue statin at discharge  R/O DIABETES: NEW HGA1C PENDING Lab Results  Component Value Date   HGBA1C 5.9 06/27/2016  HgbA1c goal < 7.0 Continue CBG monitoring and SSI to maintain glucose 140-180 mg/dl DM education   OBESITY Obesity, Body mass index is 32.61 kg/m. Greater than/equal to 30  Other Active Problems: Principal Problem:   TIA (transient ischemic attack) Active Problems:   ANEMIA   HTN (hypertension)   Hypokalemia    Hospital day # 0 VTE prophylaxis: Lovenox  Diet : Diet NPO time specified Fall precautions   FAMILY UPDATES: No family at bedside  TEAM UPDATES: Ivor Costa, MD   Prior Home Stroke Medications:  aspirin 81 mg daily  Discharge Stroke Meds:  Please discharge patient on aspirin 325 mg daily   Disposition: 01-Home or Self Care Therapy Recs:               PENDING Follow Up:  Follow-up Information    Garvin Fila, MD. Schedule an appointment as soon as possible for a visit in 6 week(s).   Specialties:  Neurology, Radiology Contact information: [address] [place] [address] [phone]          Midge Minium, MD -PCP Follow up in  1-2 weeks      Assessment & plan discussed with with attending physician and they are in agreement.    Mary Sella, ANP-C Stroke Neurology Team 05/24/2017 8:14 AM    05/24/2017 ATTENDING ASSESSMENT:   Patient presented with an episode of transient disorientation peripheral vision loss and mild headache she is improved except for mild short-term memory loss. 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 Recommend start aspirin and and statin and check lab work for vasculitis, hypercoagulable ED, sickle cell and ESR. Outpatient TEE. I have looked at telemetry strips in the hospital for 24 hours and A. fib has not been detected. She may need outpatient loop recorder Follow-up in the stroke clinic in 6 weeks and may need follow-up MRI at that point to follow this unusual lesion on the MRI. Discussed with patient and Dr. Evangeline Gula. Greater than 50% time during the study family visit was spent on counseling and coordination of care about her stroke, abnormal MRI and answering questions Antony Contras, [place] Pager: [phone] 05/24/2017 4:56 PM  To contact Stroke Continuity provider, please refer to [email or web address] After hours, contact General Neurology

## 2017-05-24 NOTE — H&P (Signed)
History and Physical    Holly Moyer ZOX:[id number] DOB: 04-29-1975 DOA: 05/23/2017  PCP: Midge Minium, MD   Consultants:  Neurology  Patient coming from:  Home - lives with   Chief Complaint: Visual loss and confusion  HPI: Holly Moyer is a 42 y.o. female with medical history significant of HTN, anemia, and headache who presented to the ED at [facility] for evaluation of right sided visual changes, headache and transient confusion. She was at [place] at 1:45 PM yesterday when she developed visual changes and a headache.She became somewhat disoriented and had difficulty figuring out how to park her car. Within 1-2 hours her symptoms starting improving and eventually resolved. Patient was seen by tele neurology in [facility] and was transferred to [facility] for further evaluation. On transfer to our facility she was seen by neurology and recommended be admitted for TIA work up. Patient denied any photophobia, c/o nausea, but no vomiting, no numbness or tingling, no weakness on either side of the body  Blood work demonstrated mild hypokalemia with potassium 3.3, slight elevation of creatinine to 1.02 with previously normal creatinine in September of this year.  EKG demonstrated normal sinus rhythm with sinus arrhythmia  Head CT was negative for any acute findings  Review of Systems: As per HPI; otherwise review of systems reviewed and negative.   Ambulatory Status: Ambulates without assistance  Past Medical History:  Diagnosis Date  . Allergy   . Anemia   . Breast mass in female 2010   questionable mass, no f/u (fibroadenoma vs papilloma)  . Headache(784.0)   . Hypertension   . Pneumonia    2008  . Transfusion history     Past Surgical History:  Procedure Laterality Date  . ABDOMINAL HYSTERECTOMY  2015  . BILATERAL SALPINGECTOMY Bilateral 05/13/2013   Procedure: BILATERAL SALPINGECTOMY;  Surgeon: Azalia Bilis, MD;  Location: [facility];  Service: Gynecology;   Laterality: Bilateral;  . CESAREAN SECTION     x's 2  . COLPOSCOPY  2004  . CYSTO N/A 05/13/2013   Procedure: CYSTO;  Surgeon: Azalia Bilis, MD;  Location: [facility];  Service: Gynecology;  Laterality: N/A;  . SUPRACERVICAL ABDOMINAL HYSTERECTOMY N/A 05/13/2013   Procedure: HYSTERECTOMY SUPRACERVICAL ABDOMINAL;  Surgeon: Azalia Bilis, MD;  Location: [facility];  Service: Gynecology;  Laterality: N/A;  . WISDOM TOOTH EXTRACTION      Social History   Socioeconomic History  . Marital status: Single    Spouse name: Not on file  . Number of children: 2  . Years of education: Not on file  . Highest education level: Not on file  Social Needs  . Financial resource strain: Not on file  . Food insecurity - worry: Not on file  . Food insecurity - inability: Not on file  . Transportation needs - medical: Not on file  . Transportation needs - non-medical: Not on file  Occupational History  . Not on file  Tobacco Use  . Smoking status: Never Smoker  . Smokeless tobacco: Never Used  Substance and Sexual Activity  . Alcohol use: No  . Drug use: No  . Sexual activity: Not Currently    Partners: Male    Birth control/protection: Surgical    Comment: supracervical hysterectomy/BSO  Other Topics Concern  . Not on file  Social History Narrative  . Not on file    Allergies  Allergen Reactions  . Macrobid [Nitrofurantoin Macrocrystal] Itching    Family History  Problem Relation Age of  Onset  . Hypertension Father   . Heart disease Father        MI at age 44  . Heart attack Father 74  . Diabetes Maternal Grandmother   . Heart failure Maternal Grandmother   . Ovarian cancer Maternal Grandmother   . Hypertension Paternal Grandmother   . Hypertension Paternal Grandfather   . Breast cancer Maternal Aunt     Prior to Admission medications   Medication Sig Start Date End Date Taking? Authorizing Provider  aspirin EC 81 MG tablet Take 81 mg by mouth daily.   Yes [provider]   fexofenadine (ALLEGRA) 180 MG tablet Take 180 mg by mouth daily.   Yes [provider]  GARLIC PO Take 465 mg by mouth daily.    Yes [provider]  ibuprofen (ADVIL,MOTRIN) 200 MG tablet Take 400 mg by mouth every 6 (six) hours as needed for moderate pain (pain).    Yes [provider]  Multiple Vitamin (MULTIVITAMIN) capsule Take 1 capsule by mouth daily.   Yes [provider]  benzonatate (TESSALON) 200 MG capsule Take 1 capsule (200 mg total) 2 (two) times daily as needed by mouth for cough. 02/04/17   Midge Minium, MD  promethazine-dextromethorphan (PROMETHAZINE-DM) 6.25-15 MG/5ML syrup Take 5 mLs 4 (four) times daily as needed by mouth. 02/04/17   Midge Minium, MD    Physical Exam: Vitals:   05/23/17 1828 05/23/17 2127 05/24/17 0040 05/24/17 0443  BP: 138/82 131/74 125/81 129/83  Pulse: 71 75 77 64  Resp: 16 18 16 16   Temp: 98.5 F (36.9 C) 98.3 F (36.8 C) 98.2 F (36.8 C) 98.9 F (37.2 C)  TempSrc: Oral Oral Oral Oral  SpO2: 99% 98% 100% 100%  Weight:      Height:         General:  Appears calm and comfortable Eyes: PERRLA, sckerae unicteric, EOM intact,  ENT: normal external ear canals, oral mucous membranes moist and intact, no nasal discharge Neck: no lympnadenopathy, masses or thyromegaly Cardiovascular: RRR, no murmurs, gallops or rubs, no LE edema. Respiratory: CTA bilaterally, no adventitious sounds auscultated. Normal respiratory effort. Abdomen: soft, NT / ND, BS (+) 4, no masses or organomegaly appreciated Skin: no rash or induration seen on limited exam Musculoskeletal: no joint deformities observed, active full ROM  Psychiatric: grossly normal mood and affect, speech fluent and appropriate Neurologic: CN II-XII grossly normal, no focal deficit visualized, moves all extremities independently,sensation to light touch and temperature is intact.        Radiological Exams on Admission: Ct Angio Head W Or Wo  Contrast  Result Date: 05/23/2017 CLINICAL DATA:  Arterial stricture or occlusion. Right eye visual disturbance EXAM: CT ANGIOGRAPHY HEAD AND NECK TECHNIQUE: Multidetector CT imaging of the head and neck was performed using the standard protocol during bolus administration of intravenous contrast. Multiplanar CT image reconstructions and MIPs were obtained to evaluate the vascular anatomy. Carotid stenosis measurements (when applicable) are obtained utilizing NASCET criteria, using the distal internal carotid diameter as the denominator. CONTRAST:  14mL ISOVUE-370 IOPAMIDOL (ISOVUE-370) INJECTION 76% COMPARISON:  CT head 05/18/2014 FINDINGS: CT HEAD FINDINGS Brain: No evidence of acute infarction, hemorrhage, hydrocephalus, extra-axial collection or mass lesion/mass effect. Vascular: Negative for hyperdense vessel Skull: Negative Sinuses: Negative Orbits: Negative Review of the MIP images confirms the above findings CTA NECK FINDINGS Aortic arch: Normal aortic arch and proximal great vessels Right carotid system: Normal right carotid without stenosis or dissection. No atherosclerotic disease Left  carotid system: Normal left carotid without stenosis or dissection. Negative for atherosclerotic disease. Vertebral arteries: Both vertebral arteries are widely patent and normal. Skeleton: Negative Other neck: Negative Upper chest: Lung apices clear bilaterally. Review of the MIP images confirms the above findings CTA HEAD FINDINGS Anterior circulation: Cavernous carotid normal bilaterally. Anterior and middle cerebral arteries normal bilaterally. Posterior circulation: Both vertebral arteries patent to the basilar. Basilar normal. PICA, superior cerebellar, and posterior cerebral arteries normal bilaterally. No stenosis or occlusion. Venous sinuses: Negative Anatomic variants: None Delayed phase: Normal enhancement on delayed imaging. Review of the MIP images confirms the above findings IMPRESSION: Negative CTA head  and neck These results were called by telephone at the time of interpretation on 05/23/2017 at 3:19 pm to Dr. Gareth Morgan , who verbally acknowledged these results. Electronically Signed   By: Franchot Gallo M.D.   On: 05/23/2017 15:19   Ct Angio Neck W And/or Wo Contrast  Result Date: 05/23/2017 CLINICAL DATA:  Arterial stricture or occlusion. Right eye visual disturbance EXAM: CT ANGIOGRAPHY HEAD AND NECK TECHNIQUE: Multidetector CT imaging of the head and neck was performed using the standard protocol during bolus administration of intravenous contrast. Multiplanar CT image reconstructions and MIPs were obtained to evaluate the vascular anatomy. Carotid stenosis measurements (when applicable) are obtained utilizing NASCET criteria, using the distal internal carotid diameter as the denominator. CONTRAST:  118mL ISOVUE-370 IOPAMIDOL (ISOVUE-370) INJECTION 76% COMPARISON:  CT head 05/18/2014 FINDINGS: CT HEAD FINDINGS Brain: No evidence of acute infarction, hemorrhage, hydrocephalus, extra-axial collection or mass lesion/mass effect. Vascular: Negative for hyperdense vessel Skull: Negative Sinuses: Negative Orbits: Negative Review of the MIP images confirms the above findings CTA NECK FINDINGS Aortic arch: Normal aortic arch and proximal great vessels Right carotid system: Normal right carotid without stenosis or dissection. No atherosclerotic disease Left carotid system: Normal left carotid without stenosis or dissection. Negative for atherosclerotic disease. Vertebral arteries: Both vertebral arteries are widely patent and normal. Skeleton: Negative Other neck: Negative Upper chest: Lung apices clear bilaterally. Review of the MIP images confirms the above findings CTA HEAD FINDINGS Anterior circulation: Cavernous carotid normal bilaterally. Anterior and middle cerebral arteries normal bilaterally. Posterior circulation: Both vertebral arteries patent to the basilar. Basilar normal. PICA, superior  cerebellar, and posterior cerebral arteries normal bilaterally. No stenosis or occlusion. Venous sinuses: Negative Anatomic variants: None Delayed phase: Normal enhancement on delayed imaging. Review of the MIP images confirms the above findings IMPRESSION: Negative CTA head and neck These results were called by telephone at the time of interpretation on 05/23/2017 at 3:19 pm to Dr. Gareth Morgan , who verbally acknowledged these results. Electronically Signed   By: Franchot Gallo M.D.   On: 05/23/2017 15:19    EKG: Independently reviewed.  NSR , QIII of unknown duration  Labs on Admission: I have personally reviewed the available labs and imaging studies at the time of the admission.  Assessment/Plan Principal Problem:   TIA (transient ischemic attack) Active Problems:   ANEMIA   HTN (hypertension)   Hypokalemia   TIA  Patient admitted rule out TIA/stroke protocol Head and neck CT angiogram was negative for acute findings Awaits for head MRI and Echo So far telemetry did not show any significant changes Hemoglobin A1c and lipid panel are pending Continue aspirin full dose  Hypertension His blood pressure currently is controlled Continue to monitor and allow permissive hypertension  Anemia Her hemoglobin is currently within normal limits-14.4  Hypokalemia Replete and recheck   DVT prophylaxis: Lovenox Code  Status: Full - confirmed with patient Family Communication: none Disposition Plan: Home once clinically improved Consults called: Neurology Admission status: Observation   York Grice PA-C [phone] [facility]  If note is complete, please contact covering daytime or nighttime physician. [email or web address] Password [facility]  05/24/2017, 7:36 AM

## 2017-05-24 NOTE — Progress Notes (Signed)
  Echocardiogram 2D Echocardiogram has been performed.  Fadia Marlar L Androw 05/24/2017, 2:07 PM

## 2017-05-24 NOTE — Progress Notes (Signed)
Pt returned back from her procedures.  No complaints of pain or discomfort.

## 2017-05-24 NOTE — Care Management (Signed)
This is a no charge note  Pending admission:  42 year old lady with past medical history of hypertension, who presents with transient confusion and right eye vision loss. No unilateral weakness or numbness in extremities. Neurology was consulted. Dr. Leonel Ramsay recommended TIA workup. TIA admission order was put in. Pt is admitted to tele bed for obs.     Ivor Costa, MD  [facility] Pager [phone]  If 7PM-7AM, please contact night-coverage [email or web address] Password [email or web address] 05/24/2017, 2:09 AM

## 2017-05-24 NOTE — Progress Notes (Signed)
SLP Cancellation Note  Patient Details Name: Holly Moyer MRN: [id number] DOB: 01/30/1976   Cancelled treatment:       Reason Eval/Treat Not Completed: SLP screened, no needs identified, will sign off   Juan Quam Laurice 05/24/2017, 9:46 AM

## 2017-05-24 NOTE — Discharge Summary (Signed)
Physician Discharge Summary  Mckenze Slone LTJ:[id number] DOB: [birth year] DOA: 05/23/2017  PCP: Midge Minium, MD  Admit date: 05/23/2017 Discharge date: 05/24/2017  Time spent: 45 minutes  Recommendations for Outpatient Follow-up:  1. Follow up with Dr.Sethi in stroke clinic in 6 weeks 2. Continue aspirin daily 3. Continue Lipitor daily 4. Expect phone call from a cardiology service with appointment for transesophageal echocardiogram and heart monitor   Discharge Diagnoses:  Principal Problem:   TIA (transient ischemic attack) Active Problems:   ANEMIA   HTN (hypertension)   Hypokalemia   Discharge Condition: Stable  Diet recommendation: Heart healthy  Filed Weights   05/23/17 1417  Weight: 86.2 kg (190 lb)    History of present illness:   Rosamae Rocque is a 42 y.o. female with medical history significant of HTN, anemia, and headache who presented to the ED at [facility] for evaluation of right sided visual changes, headache and transient confusion. She was at [place] at 1:45 PM yesterday when she developed visual changes and a headache.She became somewhat disoriented and had difficulty figuring out how to park her car. Within 1-2 hours her symptoms starting improving and eventually resolved. Patient was seen by tele neurology in [facility] and was transferred to [facility] for further evaluation. On transfer to our facility she was seen by neurology and recommended be admitted for TIA work up. Patient denied any photophobia, c/o nausea, but no vomiting, no numbness or tingling, no weakness on either side of the body   Blood work was essentially benign except mild hypokalemia. EKG demonstrated normal sinus rhythm with sinus arrhythmia Head CT was negative for any acute findings   Hospital Course:  Patient was admitted on stroke protocol and seen by neurology.  Brain MRI demonstrated T2/FLAIR signal associated with the left hippocampus, no mass-effect or hemorrhage,  but this could be seen with an acute ischemic infarction or transient global amnesia, in postictal state and with limbic encephalitis. Dr. Leonie Man, neurologist, felt like patient had an acute stroke and recommended she be seen by cardiology in outpatient setting to arrange TEE and Holter's monitoring She also need to see Dr. Leonie Man in stroke clinic in 6 weeks She received supplemental potassium to correct hypokalemia  Procedures: MRI  Consultations: Neurology  Discharge Exam: Vitals:   05/24/17 0443 05/24/17 0751  BP: 129/83 134/79  Pulse: 64 79  Resp: 16 17  Temp: 98.9 F (37.2 C) 98.4 F (36.9 C)  SpO2: 100% 98%     General:  Appears calm and comfortable  Eyes: PERRLA, sckerae unicteric, EOM intact,   ENT: normal external ear canals, oral mucous membranes moist and intact, no nasal discharge  Neck: no lympnadenopathy, masses or thyromegaly  Cardiovascular: RRR, no murmurs, gallops or rubs, no LE edema.  Respiratory: CTA bilaterally, no adventitious sounds auscultated. Normal respiratory effort.  Abdomen: soft, NT / ND, BS (+) 4, no masses or organomegaly appreciated  Skin: no rash or induration seen on limited exam  Musculoskeletal: no joint deformities observed, active full ROM   Psychiatric: grossly normal mood and affect, speech fluent and appropriate  Neurologic: CN II-XII grossly normal, no focal deficit visualized, moves all extremities independently,sensation to light touch and temperature is intact.      Discharge Instructions   Discharge Instructions    Ambulatory referral to Neurology   Complete by:  As directed    An appointment is requested in approximately: 6 weeks Follow up with stroke clinic (Dr Leonie Man preferred, if not available, then consider  Caesar Chestnut, Penumalli or Henrietta whoever is available) at [facility] in about 6-8 weeks. Thanks.   Diet - low sodium heart healthy   Complete by:  As directed    Discharge instructions   Complete by:  As directed     Expect a follow-up call from cardiology on Monday to set up a trans-esophageal echocardiogram and a heart monitor. Please follow-up with Dr. Crist Fat at the stroke clinic in 6 weeks.  Please take your aspirin daily.   Increase activity slowly   Complete by:  As directed       Allergies  Allergen Reactions  . Macrobid [Nitrofurantoin Macrocrystal] Itching   Follow-up Information    Garvin Fila, MD. Schedule an appointment as soon as possible for a visit in 6 week(s).   Specialties:  Neurology, Radiology Contact information: [address] [place] [address] [phone]            The results of significant diagnostics from this hospitalization (including imaging, microbiology, ancillary and laboratory) are listed below for reference.    Significant Diagnostic Studies: Ct Angio Head W Or Wo Contrast  Result Date: 05/23/2017 CLINICAL DATA:  Arterial stricture or occlusion. Right eye visual disturbance EXAM: CT ANGIOGRAPHY HEAD AND NECK TECHNIQUE: Multidetector CT imaging of the head and neck was performed using the standard protocol during bolus administration of intravenous contrast. Multiplanar CT image reconstructions and MIPs were obtained to evaluate the vascular anatomy. Carotid stenosis measurements (when applicable) are obtained utilizing NASCET criteria, using the distal internal carotid diameter as the denominator. CONTRAST:  150m ISOVUE-370 IOPAMIDOL (ISOVUE-370) INJECTION 76% COMPARISON:  CT head 05/18/2014 FINDINGS: CT HEAD FINDINGS Brain: No evidence of acute infarction, hemorrhage, hydrocephalus, extra-axial collection or mass lesion/mass effect. Vascular: Negative for hyperdense vessel Skull: Negative Sinuses: Negative Orbits: Negative Review of the MIP images confirms the above findings CTA NECK FINDINGS Aortic arch: Normal aortic arch and proximal great vessels Right carotid system: Normal right carotid without stenosis or dissection. No  atherosclerotic disease Left carotid system: Normal left carotid without stenosis or dissection. Negative for atherosclerotic disease. Vertebral arteries: Both vertebral arteries are widely patent and normal. Skeleton: Negative Other neck: Negative Upper chest: Lung apices clear bilaterally. Review of the MIP images confirms the above findings CTA HEAD FINDINGS Anterior circulation: Cavernous carotid normal bilaterally. Anterior and middle cerebral arteries normal bilaterally. Posterior circulation: Both vertebral arteries patent to the basilar. Basilar normal. PICA, superior cerebellar, and posterior cerebral arteries normal bilaterally. No stenosis or occlusion. Venous sinuses: Negative Anatomic variants: None Delayed phase: Normal enhancement on delayed imaging. Review of the MIP images confirms the above findings IMPRESSION: Negative CTA head and neck These results were called by telephone at the time of interpretation on 05/23/2017 at 3:19 pm to Dr. EGareth Morgan, who verbally acknowledged these results. Electronically Signed   By: CFranchot GalloM.D.   On: 05/23/2017 15:19   Ct Angio Neck W And/or Wo Contrast  Result Date: 05/23/2017 CLINICAL DATA:  Arterial stricture or occlusion. Right eye visual disturbance EXAM: CT ANGIOGRAPHY HEAD AND NECK TECHNIQUE: Multidetector CT imaging of the head and neck was performed using the standard protocol during bolus administration of intravenous contrast. Multiplanar CT image reconstructions and MIPs were obtained to evaluate the vascular anatomy. Carotid stenosis measurements (when applicable) are obtained utilizing NASCET criteria, using the distal internal carotid diameter as the denominator. CONTRAST:  1058mISOVUE-370 IOPAMIDOL (ISOVUE-370) INJECTION 76% COMPARISON:  CT head 05/18/2014 FINDINGS: CT HEAD FINDINGS Brain: No evidence  of acute infarction, hemorrhage, hydrocephalus, extra-axial collection or mass lesion/mass effect. Vascular: Negative for hyperdense  vessel Skull: Negative Sinuses: Negative Orbits: Negative Review of the MIP images confirms the above findings CTA NECK FINDINGS Aortic arch: Normal aortic arch and proximal great vessels Right carotid system: Normal right carotid without stenosis or dissection. No atherosclerotic disease Left carotid system: Normal left carotid without stenosis or dissection. Negative for atherosclerotic disease. Vertebral arteries: Both vertebral arteries are widely patent and normal. Skeleton: Negative Other neck: Negative Upper chest: Lung apices clear bilaterally. Review of the MIP images confirms the above findings CTA HEAD FINDINGS Anterior circulation: Cavernous carotid normal bilaterally. Anterior and middle cerebral arteries normal bilaterally. Posterior circulation: Both vertebral arteries patent to the basilar. Basilar normal. PICA, superior cerebellar, and posterior cerebral arteries normal bilaterally. No stenosis or occlusion. Venous sinuses: Negative Anatomic variants: None Delayed phase: Normal enhancement on delayed imaging. Review of the MIP images confirms the above findings IMPRESSION: Negative CTA head and neck These results were called by telephone at the time of interpretation on 05/23/2017 at 3:19 pm to Dr. Gareth Morgan , who verbally acknowledged these results. Electronically Signed   By: Franchot Gallo M.D.   On: 05/23/2017 15:19   Mr Brain Wo Contrast  Result Date: 05/24/2017 CLINICAL DATA:  Right-sided visual disturbance and transient confusion. Headache. EXAM: MRI HEAD WITHOUT CONTRAST TECHNIQUE: Multiplanar, multiecho pulse sequences of the brain and surrounding structures were obtained without intravenous contrast. COMPARISON:  CT 05/23/2017 FINDINGS: Brain: There is restricted diffusion and increased FLAIR and T2 signal associated with the hippocampus on the left. No mass effect. No hemorrhage. The remainder of the brain appears normal without evidence of old infarction, mass lesion,  hemorrhage, hydrocephalus or extra-axial collection. Vascular: Major vessels at the base of the brain show flow. Skull and upper cervical spine: Negative Sinuses/Orbits: Clear/normal Other: None IMPRESSION: Abnormal restricted diffusion and T2/FLAIR signal associated with the left hippocampus. No mass effect or hemorrhage. Whereas this could be an acute ischemic infarction, that is unlikely at this age. This pattern of finding can be seen with transient global amnesia, in postictal states and with limbic encephalitis. Electronically Signed   By: Nelson Chimes M.D.   On: 05/24/2017 13:26     Labs: Basic Metabolic Panel: Recent Labs  Lab 05/23/17 1429  NA 137  K 3.3*  CL 103  CO2 24  GLUCOSE 95  BUN 13  CREATININE 1.02*  CALCIUM 9.6   Liver Function Tests: Recent Labs  Lab 05/23/17 1429  AST 20  ALT 14  ALKPHOS 57  BILITOT 1.2  PROT 8.8*  ALBUMIN 4.8   CBC: Recent Labs  Lab 05/23/17 1429  WBC 7.2  NEUTROABS 3.7  HGB 14.4  HCT 42.2  MCV 85.9  PLT 263   Cardiac Enzymes: Recent Labs  Lab 05/23/17 1429  TROPONINI <0.03    CBG: Recent Labs  Lab 05/23/17 1412  GLUCAP 83   Pending labs at time of discharge are anti-phospholipid syndrome workup, sickle cell screen, ANA with reflex if positive, ESR. Lipid panel demonstrated elevated LDL 133, otherwise was normal Hemoglobin A1c 5.7% HIV screen was negative   Signed:  York Grice MD.  [facility] 05/24/2017, 6:01 PM

## 2017-05-24 NOTE — Progress Notes (Signed)
PHARMACIST - PHYSICIAN ORDER COMMUNICATION  CONCERNING: P&T Medication Policy on Herbal Medications  DESCRIPTION:  This patient's order for:  garlic  has been noted.  This product(s) is classified as an "herbal" or natural product. Due to a lack of definitive safety studies or FDA approval, nonstandard manufacturing practices, plus the potential risk of unknown drug-drug interactions while on inpatient medications, the Pharmacy and Therapeutics Committee does not permit the use of "herbal" or natural products of this type within [place].   ACTION TAKEN: The pharmacy department is unable to verify this order at this time and your patient has been informed of this safety policy. Please reevaluate patient's clinical condition at discharge and address if the herbal or natural product(s) should be resumed at that time.   

## 2017-05-25 LAB — ANA W/REFLEX IF POSITIVE: Anti Nuclear Antibody(ANA): NEGATIVE

## 2017-05-25 LAB — SICKLE CELL SCREEN: SICKLE CELL SCREEN: NEGATIVE

## 2017-05-27 ENCOUNTER — Encounter: Payer: Self-pay | Admitting: Emergency Medicine

## 2017-05-27 ENCOUNTER — Encounter: Payer: Self-pay | Admitting: Family Medicine

## 2017-05-27 ENCOUNTER — Telehealth: Payer: Self-pay | Admitting: Neurology

## 2017-05-27 ENCOUNTER — Telehealth: Payer: Self-pay | Admitting: Physician Assistant

## 2017-05-27 ENCOUNTER — Ambulatory Visit (INDEPENDENT_AMBULATORY_CARE_PROVIDER_SITE_OTHER): Payer: 59 | Admitting: Family Medicine

## 2017-05-27 ENCOUNTER — Other Ambulatory Visit: Payer: Self-pay

## 2017-05-27 VITALS — BP 138/90 | HR 78 | Temp 98.7°F | Resp 16 | Ht 64.75 in | Wt 184.8 lb

## 2017-05-27 DIAGNOSIS — G459 Transient cerebral ischemic attack, unspecified: Secondary | ICD-10-CM | POA: Diagnosis not present

## 2017-05-27 DIAGNOSIS — R030 Elevated blood-pressure reading, without diagnosis of hypertension: Secondary | ICD-10-CM | POA: Diagnosis not present

## 2017-05-27 NOTE — Telephone Encounter (Signed)
[facility] [phone] (c) care needs to know what type of heart monitor Dr Leonie Man is requesting. She said it is documented for loop or heart monitor. Please call to advise

## 2017-05-27 NOTE — Telephone Encounter (Signed)
Yes I think outpatient loop recorder is indicated

## 2017-05-27 NOTE — Telephone Encounter (Signed)
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 There was a paper request left to schedule outpatient TEE for this patient (recently dc s/p TIA). Cardiac monitoring request was not listed in the request but per review of chart, may need some sort of monitor to r/o afib. I am unable to tell what is needed from the notes (IM recommends Holter or heart monitor; Dr. Clydene Fake notes indicate she may need a loop recorder). I left message with [facility] to pass on to Dr. Leonie Man to help clarify. Once this is established, will need to arrange. Dayna Dunn PA-C

## 2017-05-27 NOTE — Telephone Encounter (Signed)
Spoke with [facility] about the request below - as she has to call patient to discuss loop recorder, she stated she will take care of scheduling the TEE/loop (+ explaining procedure to patient with instructions for where to present). She asked me to forward this message onward to her to handle request. Melina Copa PA-C

## 2017-05-27 NOTE — Telephone Encounter (Signed)
Garvin Fila, MD  to Charlie Pitter, PA-C     05/27/17 2:35 PM  Note    Yes I think outpatient loop recorder is indicated.

## 2017-05-27 NOTE — Patient Instructions (Signed)
F/u with Dr. Birdie Riddle on feb 28th.   I believe your blood pressure is in part elevated due to stress.   Make an appointment with the neurologist to help better explain your diagnosis.   I think some of your symptoms could be stress related.   But, go to ER for any weakness, double vision, speech changes, severe headaches.

## 2017-05-27 NOTE — Progress Notes (Signed)
Subjective  CC:  Chief Complaint  Patient presents with  . Hypertension    142/104 this am, 140/100 was at 10am, blurred vision, headaches, head pressure    HPI: Holly Moyer is a 42 y.o. female who presents to the office today to address the problems listed above in the chief complaint.  This 42 yo patient had a likely acute stroke vs TIA - admitted 2/21-2/22. I reviewed hospital notes, labs and imaging study reports.  Briefly, she had about 1-2 hours of cognitive changes and blurred vision.  She was eventually evaluated at the ER.  She was admitted for possible TIA workup.  Lab work was unremarkable.  Blood pressure was normal throughout her entire stay.  A brain MRI did have some changes that could be consistent with stroke.  Neurologist was concern for acute stroke, she was discharged and has follow-up with him.  An EEG was negative for seizures.  She was discharged on Lipitor and aspirin.  She never required blood pressure medications.  Since, she continues to feel unsteady.  She feels like her vision is not normal and complains of a headache.  She checked her blood pressure today and it was elevated as listed above.  She has a history of what sounds like pre-hypertension.  She and her doctor were managing this conservatively.  She does have a strong family history of vascular disease.  She was evaluated by cardiology and neurology while in the hospital.  Due to the concern for stroke, she is to be scheduled for a TEE.  Since being discharged, she feels confused about her diagnosis and prognosis.  She has questions about returning to work.  She currently does not feel well enough to do that.  She also is stressed not only related to her medical health but also she is moving tomorrow.  BP Readings from Last 3 Encounters:  05/27/17 138/90, initial bp here was 142/100  05/24/17 134/79  02/04/17 (!) 142/83   Wt Readings from Last 3 Encounters:  05/27/17 184 lb 12.8 oz (83.8 kg)    05/23/17 190 lb (86.2 kg)  02/04/17 193 lb (87.5 kg)    Lab Results  Component Value Date   CHOL 195 05/24/2017   CHOL 194 12/27/2016   CHOL 200 06/27/2016   Lab Results  Component Value Date   HDL 48 05/24/2017   HDL 52.00 12/27/2016   HDL 52.80 06/27/2016   Lab Results  Component Value Date   LDLCALC 133 (H) 05/24/2017   LDLCALC 110 (H) 12/27/2016   LDLCALC 125 (H) 06/27/2016   Lab Results  Component Value Date   TRIG 71 05/24/2017   TRIG 158.0 (H) 12/27/2016   TRIG 110.0 06/27/2016   Lab Results  Component Value Date   CHOLHDL 4.1 05/24/2017   CHOLHDL 4 12/27/2016   CHOLHDL 4 06/27/2016   No results found for: LDLDIRECT Lab Results  Component Value Date   CREATININE 1.02 (H) 05/23/2017   BUN 13 05/23/2017   NA 137 05/23/2017   K 3.3 (L) 05/23/2017   CL 103 05/23/2017   CO2 24 05/23/2017    The 10-year ASCVD risk score Mikey Bussing DC Jr., et al., 2013) is: 1.1%   Values used to calculate the score:     Age: 63 years     Sex: Female     Is Non-Hispanic African American: Yes     Diabetic: No     Tobacco smoker: No     Systolic Blood Pressure: 301 mmHg  Is BP treated: No     HDL Cholesterol: 48 mg/dL     Total Cholesterol: 195 mg/dL  I reviewed the patients updated PMH, FH, and SocHx.    Patient Active Problem List   Diagnosis Date Noted  . Hypokalemia 05/24/2017  . TIA (transient ischemic attack) 05/23/2017  . Physical exam 12/27/2016  . HTN (hypertension) 06/27/2016  . Obesity (BMI 30.0-34.9) 06/27/2016  . S/P hysterectomy 05/13/2013  . Menorrhagia 02/19/2013  . Chest pain 02/19/2013  . DOE (dyspnea on exertion) 02/19/2013  . MICROSCOPIC HEMATURIA 02/10/2009  . ACUTE CYSTITIS 12/31/2008  . BREAST MASS, RIGHT 06/30/2008  . OTHER ABSCESS OF VULVA 05/13/2008  . BRUISE 05/13/2008  . ANEMIA 01/16/2008    Allergies: Macrobid [nitrofurantoin macrocrystal]  Social History: Patient  reports that  has never smoked. she has never used smokeless  tobacco. She reports that she does not drink alcohol or use drugs.  Current Meds  Medication Sig  . acetaminophen (TYLENOL) 500 MG tablet Take 1,000 mg by mouth every 8 (eight) hours as needed.  Marland Kitchen aspirin EC 81 MG tablet Take 81 mg by mouth daily.  Marland Kitchen atorvastatin (LIPITOR) 40 MG tablet Take 1 tablet (40 mg total) by mouth daily at 6 PM.  . fexofenadine (ALLEGRA) 180 MG tablet Take 180 mg by mouth daily.  Marland Kitchen GARLIC PO Take 825 mg by mouth daily.   . Multiple Vitamin (MULTIVITAMIN) capsule Take 1 capsule by mouth daily.    Review of Systems: Cardiovascular: negative for chest pain, palpitations, leg swelling, orthopnea Respiratory: negative for SOB, wheezing or persistent cough Gastrointestinal: negative for abdominal pain Genitourinary: negative for dysuria or gross hematuria  Objective  Vitals: BP 138/90 Comment: rechecked by cma after 10 minute resting  Pulse 78   Temp 98.7 F (37.1 C) (Oral)   Resp 16   Ht 5' 4.75" (1.645 m)   Wt 184 lb 12.8 oz (83.8 kg)   LMP 05/09/2013 Comment: Spotting only  SpO2 99%   BMI 30.99 kg/m  General: no acute distress  Psych:  Alert and oriented, normal mood and affect, normal cognition and speech HEENT:  Normocephalic, atraumatic, supple neck  Cardiovascular:  RRR without murmur. no edema Respiratory:  Good breath sounds bilaterally, CTAB with normal respiratory effort Skin:  Warm, no rashes Neurologic:   Mental status is normal, no tremor. Nl UE and LE strength  Assessment  1. TIA (transient ischemic attack)   2. Elevated blood pressure reading      Plan    Hypertension f/u: blood pressure was normal throughout her entire hospitalization.  Blood pressure responded well to rest and relaxation.  I am concerned that her hypertensive response currently is related to situational stressors.  Elected not to treat at this time.  Recommend follow-up with Dr. Birdie Riddle on February 28 to recheck blood pressure and further assess ability to work.     TIA versus acute stroke: Recommend follow-up with neurology.  She still is undergoing further workup with neurology and cardiology.  Continue aspirin therapy.  Continue Lipitor.  Discussed symptoms of stroke and emergent management if they return.   Follow up: February 28   Commons side effects, risks, benefits, and alternatives for medications and treatment plan prescribed today were discussed, and the patient expressed understanding of the given instructions. Patient is instructed to call or message via MyChart if he/she has any questions or concerns regarding our treatment plan. No barriers to understanding were identified. We discussed Red Flag symptoms and signs in detail.  Patient expressed understanding regarding what to do in case of urgent or emergency type symptoms.   Medication list was reconciled, printed and provided to the patient in AVS. Patient instructions and summary information was reviewed with the patient as documented in the AVS. This note was prepared with assistance of Dragon voice recognition software. Occasional wrong-word or sound-a-like substitutions may have occurred due to the inherent limitations of voice recognition software  No orders of the defined types were placed in this encounter.  No orders of the defined types were placed in this encounter.

## 2017-05-28 LAB — ANTIPHOSPHOLIPID SYNDROME EVAL, BLD
Anticardiolipin IgG: <9 GPL U/mL (ref 0–14)
Anticardiolipin IgM: 12 MPL U/mL (ref 0–12)
DRVVT: 34.6 s (ref 0.0–47.0)
PHOSPHATYDALSERINE, IGA: 1 {APS'U} (ref 0–20)
PTT LA: 29.9 s (ref 0.0–51.9)
Phosphatydalserine, IgG: 6 GPS IgG (ref 0–11)
Phosphatydalserine, IgM: 7 MPS IgM (ref 0–25)

## 2017-05-29 ENCOUNTER — Telehealth: Payer: Self-pay

## 2017-05-29 NOTE — Telephone Encounter (Signed)
-----   Message from Garvin Fila, MD sent at 05/29/2017  1:09 PM EST ----- Kindly inform the patient that antiphospholipid antibodies blood work for abnormal clotting was negative.

## 2017-05-29 NOTE — Telephone Encounter (Signed)
Notes recorded by Marval Regal, RN on 05/29/2017 at 3:13 PM EST Left vm for patient to call back about lab work results. ------

## 2017-05-30 ENCOUNTER — Other Ambulatory Visit: Payer: Self-pay

## 2017-05-30 ENCOUNTER — Encounter: Payer: Self-pay | Admitting: Family Medicine

## 2017-05-30 ENCOUNTER — Ambulatory Visit (INDEPENDENT_AMBULATORY_CARE_PROVIDER_SITE_OTHER): Payer: 59 | Admitting: Family Medicine

## 2017-05-30 VITALS — BP 123/82 | HR 78 | Temp 98.7°F | Resp 16 | Ht 65.0 in | Wt 184.0 lb

## 2017-05-30 DIAGNOSIS — R51 Headache: Secondary | ICD-10-CM | POA: Diagnosis not present

## 2017-05-30 DIAGNOSIS — G459 Transient cerebral ischemic attack, unspecified: Secondary | ICD-10-CM | POA: Diagnosis not present

## 2017-05-30 DIAGNOSIS — I10 Essential (primary) hypertension: Secondary | ICD-10-CM | POA: Diagnosis not present

## 2017-05-30 DIAGNOSIS — R519 Headache, unspecified: Secondary | ICD-10-CM

## 2017-05-30 LAB — BASIC METABOLIC PANEL
BUN: 14 mg/dL (ref 6–23)
CALCIUM: 10 mg/dL (ref 8.4–10.5)
CO2: 28 mEq/L (ref 19–32)
Chloride: 105 mEq/L (ref 96–112)
Creatinine, Ser: 0.98 mg/dL (ref 0.40–1.20)
GFR: 80.58 mL/min (ref >60.00)
GLUCOSE: 95 mg/dL (ref 70–99)
POTASSIUM: 4.2 meq/L (ref 3.5–5.1)
Sodium: 139 mEq/L (ref 135–145)

## 2017-05-30 MED ORDER — HYDROCHLOROTHIAZIDE 12.5 MG PO TABS: 12.5000 mg | ORAL_TABLET | Freq: Every day | ORAL | 3 refills | Status: DC

## 2017-05-30 NOTE — Progress Notes (Signed)
   Subjective:    Patient ID: Holly Moyer, female    DOB: 12/06/1975, 42 y.o.   MRN: [id number]  [facility] f/u- pt was admitted 2/21-22 after a 1-2 hr episode of blurry vision and cognitive changes.  It was felt that she was having a stroke/TIA.  CT (-), MRI w/ changes that could be consistent w/ CVA.  EEG negative.  Has TEE pending and f/u w/ Dr Leonie Man at Neurology.  She was started on ASA and Lipitor (due to LDL of 133).  BP is normal today.  Pt continues to have HAs and dizziness as well as 'random things I can't remember'.  Couldn't remember city mom lived in, computer password.  Pt admits to very poor sleep recently as she was in the process of moving when she was hospitalized.  Is having daily HAs- typically at crown of head.  Dizziness will accompany the headache.  No nausea/vomiting.   Review of Systems For ROS see HPI     Objective:   Physical Exam  Constitutional: She is oriented to person, place, and time. She appears well-developed and well-nourished. No distress.  HENT:  Head: Normocephalic and atraumatic.  Eyes: Conjunctivae and EOM are normal. Pupils are equal, round, and reactive to light.  Neck: Normal range of motion. Neck supple. No thyromegaly present.  Cardiovascular: Normal rate, regular rhythm, normal heart sounds and intact distal pulses.  No murmur heard. Pulmonary/Chest: Effort normal and breath sounds normal. No respiratory distress.  Abdominal: Soft. She exhibits no distension. There is no tenderness.  Musculoskeletal: She exhibits no edema.  Lymphadenopathy:    She has no cervical adenopathy.  Neurological: She is alert and oriented to person, place, and time. No cranial nerve deficit. Coordination normal.  Skin: Skin is warm and dry.  Psychiatric: She has a normal mood and affect. Her behavior is normal.  Vitals reviewed.         Assessment & Plan:  Daily headache- new since hospital D/C.  Pt feels it is stress related. She knows BP has been  fluctuating.  No neuro deficits today- recent head imaging WNL w/ exception of area in hippocampus.  Has neuro f/u pending.  Add HCTZ for better BP control and monitor for improvement in HAs.  Will follow closely.  Pt expressed understanding and is in agreement w/ plan.

## 2017-05-30 NOTE — Assessment & Plan Note (Signed)
New.  Reviewed pt's hospital notes, imaging and labs.  She has TEE pending and f/u w/ neurology.  Now on ASA and Lipitor.  Reviewed reasons for both.  Also discussed need for stress management and BP control.  No residual deficits from stroke w/ exception of dizziness and HA which may be BP related.  Start low dose HCTZ and monitor closely for improvement.  Reviewed supportive care and red flags that should prompt return.  Pt expressed understanding and is in agreement w/ plan.

## 2017-05-30 NOTE — Assessment & Plan Note (Signed)
Pt has hx of this but is not currently on meds.  Pt's BP will rise w/ stress and she has been very stressed recently.  Will add low dose HCTZ and monitor for improvement in headaches, dizziness, and BP.  Pt expressed understanding and is in agreement w/ plan.

## 2017-05-30 NOTE — Patient Instructions (Signed)
Follow up in 2-3 weeks to recheck BP and headaches We'll notify you of your lab results and make any changes if needed START the HCTZ 12.5mg  daily for blood pressure Tylenol as needed for headaches Drink plenty of fluids REST!  REST!  REST! Call with any questions or concerns Hang in there!!!

## 2017-05-31 NOTE — Telephone Encounter (Signed)
Amber, I do not see any further scheduling/notes from your desktop - just wanted to make sure you were still planning to address. This is the patient we spoke about on Monday. Dayna Dunn PA-C

## 2017-06-03 ENCOUNTER — Inpatient Hospital Stay: Payer: 59 | Admitting: Family Medicine

## 2017-06-03 ENCOUNTER — Telehealth: Payer: Self-pay

## 2017-06-03 NOTE — Telephone Encounter (Signed)
Rn was calling about lab work. Pt stated on her discharge papers she was schedule to have more test done with a cardiologist. Pt has not heard anything on the appt or testing. Rn stated a message will be sent to Dr. Leonie Man. Pt verbalized understanding.

## 2017-06-03 NOTE — Telephone Encounter (Signed)
RN call patient that the antiphospholipid antibodies blood work for abnormal clotting was negative.Pt verbalized understanding. ------

## 2017-06-03 NOTE — Telephone Encounter (Signed)
Yes I believe outpatient loop recorder was planned

## 2017-06-04 ENCOUNTER — Encounter: Payer: Self-pay | Admitting: Certified Nurse Midwife

## 2017-06-04 ENCOUNTER — Encounter (HOSPITAL_COMMUNITY): Payer: Self-pay | Admitting: Emergency Medicine

## 2017-06-04 ENCOUNTER — Other Ambulatory Visit: Payer: Self-pay | Admitting: Neurology

## 2017-06-04 ENCOUNTER — Other Ambulatory Visit: Payer: Self-pay

## 2017-06-04 ENCOUNTER — Emergency Department (HOSPITAL_COMMUNITY)
Admission: EM | Admit: 2017-06-04 | Discharge: 2017-06-04 | Disposition: A | Discharge status: Home / Self Care | Payer: 59 | Attending: Emergency Medicine | Admitting: Emergency Medicine

## 2017-06-04 ENCOUNTER — Ambulatory Visit (INDEPENDENT_AMBULATORY_CARE_PROVIDER_SITE_OTHER): Payer: 59 | Admitting: Certified Nurse Midwife

## 2017-06-04 ENCOUNTER — Emergency Department (HOSPITAL_COMMUNITY): Payer: 59

## 2017-06-04 VITALS — BP 120/84 | HR 80 | Resp 20 | Ht 64.5 in | Wt 184.0 lb

## 2017-06-04 DIAGNOSIS — R0602 Shortness of breath: Secondary | ICD-10-CM | POA: Diagnosis present

## 2017-06-04 DIAGNOSIS — R531 Weakness: Secondary | ICD-10-CM | POA: Diagnosis not present

## 2017-06-04 DIAGNOSIS — I639 Cerebral infarction, unspecified: Secondary | ICD-10-CM

## 2017-06-04 DIAGNOSIS — Z7982 Long term (current) use of aspirin: Secondary | ICD-10-CM | POA: Diagnosis not present

## 2017-06-04 DIAGNOSIS — R0789 Other chest pain: Secondary | ICD-10-CM | POA: Insufficient documentation

## 2017-06-04 DIAGNOSIS — I10 Essential (primary) hypertension: Secondary | ICD-10-CM | POA: Diagnosis not present

## 2017-06-04 DIAGNOSIS — Z01419 Encounter for gynecological examination (general) (routine) without abnormal findings: Secondary | ICD-10-CM

## 2017-06-04 DIAGNOSIS — R079 Chest pain, unspecified: Secondary | ICD-10-CM

## 2017-06-04 DIAGNOSIS — Z79899 Other long term (current) drug therapy: Secondary | ICD-10-CM | POA: Diagnosis not present

## 2017-06-04 LAB — D-DIMER, QUANTITATIVE: D-Dimer, Quant: 0.48 ug/mL-FEU (ref 0.00–0.50)

## 2017-06-04 LAB — BASIC METABOLIC PANEL
Anion gap: 12 (ref 5–15)
BUN: 12 mg/dL (ref 6–20)
CO2: 24 mmol/L (ref 22–32)
CREATININE: 1.02 mg/dL — AB (ref 0.44–1.00)
Calcium: 10 mg/dL (ref 8.9–10.3)
Chloride: 101 mmol/L (ref 101–111)
GFR calc Af Amer: >60 mL/min (ref >60)
GFR calc non Af Amer: >60 mL/min (ref >60)
Glucose, Bld: 89 mg/dL (ref 65–99)
POTASSIUM: 3.4 mmol/L — AB (ref 3.5–5.1)
SODIUM: 137 mmol/L (ref 135–145)

## 2017-06-04 LAB — I-STAT BETA HCG BLOOD, ED (MC, WL, AP ONLY)

## 2017-06-04 LAB — CBC
HEMATOCRIT: 42.3 % (ref 36.0–46.0)
Hemoglobin: 14.3 g/dL (ref 12.0–15.0)
MCH: 29.4 pg (ref 26.0–34.0)
MCHC: 33.8 g/dL (ref 30.0–36.0)
MCV: 86.9 fL (ref 78.0–100.0)
PLATELETS: 290 10*3/uL (ref 150–400)
RBC: 4.87 MIL/uL (ref 3.87–5.11)
RDW: 13.1 % (ref 11.5–15.5)
WBC: 8.2 10*3/uL (ref 4.0–10.5)

## 2017-06-04 LAB — I-STAT TROPONIN, ED: Troponin i, poc: 0.01 ng/mL (ref 0.00–0.08)

## 2017-06-04 NOTE — Telephone Encounter (Signed)
Reached out to [employer] - she has tried to contact the patient but was unsuccessful thus far. She will be trying again tomorrow. Dayna Dunn PA-C

## 2017-06-04 NOTE — Progress Notes (Signed)
42 y.o. [id number] Single  African American Fe here for annual exam. History of TIA 05/23/17 with ER visit. Has follow up with Neurology scheduled soon. Patient noted some SOB this am and pm with exerertion and some pain in back and chest. Had headache this am and that has resolved. Denies nausea, or abdominal pain. Denies jaw pain or pain down arm. Just does not feel good. Denies dizziness or vertigo at this point. "Should have been seen or evaluated today I guess". No other complaints at this point. Will hold on exam.  Patient's last menstrual period was 05/09/2013.          Sexually active: No.  The current method of family planning is supracervical hysterectomy.    Exercising: Yes.    walking Smoker:  no  Health Maintenance: Pap:  06-01-16 neg HPV HR neg History of Abnormal Pap: yes MMG:  01-01-17 negative Self Breast exams: yes Colonoscopy:  none BMD:   none TDaP:  2010 Shingles: no Pneumonia: no Hep C and HIV: HIV neg 2019 Labs: no   reports that  has never smoked. she has never used smokeless tobacco. She reports that she does not drink alcohol or use drugs.  Past Medical History:  Diagnosis Date  . Allergy   . Anemia   . Breast mass in female 2010   questionable mass, no f/u (fibroadenoma vs papilloma)  . Headache(784.0)   . Hypertension   . Pneumonia    2008  . Stroke ([place])    05/23/2017  . Transfusion history     Past Surgical History:  Procedure Laterality Date  . ABDOMINAL HYSTERECTOMY  2015  . BILATERAL SALPINGECTOMY Bilateral 05/13/2013   Procedure: BILATERAL SALPINGECTOMY;  Surgeon: Azalia Bilis, MD;  Location: [facility];  Service: Gynecology;  Laterality: Bilateral;  . CESAREAN SECTION     x's 2  . COLPOSCOPY  2004  . CYSTO N/A 05/13/2013   Procedure: CYSTO;  Surgeon: Azalia Bilis, MD;  Location: [facility];  Service: Gynecology;  Laterality: N/A;  . SUPRACERVICAL ABDOMINAL HYSTERECTOMY N/A 05/13/2013   Procedure: HYSTERECTOMY SUPRACERVICAL ABDOMINAL;  Surgeon:  Azalia Bilis, MD;  Location: [facility];  Service: Gynecology;  Laterality: N/A;  . WISDOM TOOTH EXTRACTION      Current Outpatient Medications  Medication Sig Dispense Refill  . acetaminophen (TYLENOL) 500 MG tablet Take 1,000 mg by mouth every 8 (eight) hours as needed.    Marland Kitchen aspirin EC 81 MG tablet Take 81 mg by mouth daily.    Marland Kitchen atorvastatin (LIPITOR) 40 MG tablet Take 1 tablet (40 mg total) by mouth daily at 6 PM. 30 tablet 0  . fexofenadine (ALLEGRA) 180 MG tablet Take 180 mg by mouth daily.    Marland Kitchen GARLIC PO Take 998 mg by mouth daily.     . hydrochlorothiazide (HYDRODIURIL) 12.5 MG tablet Take 1 tablet (12.5 mg total) by mouth daily. 30 tablet 3  . ibuprofen (ADVIL,MOTRIN) 200 MG tablet Take 400 mg by mouth every 6 (six) hours as needed for moderate pain (pain).     . Multiple Vitamin (MULTIVITAMIN) capsule Take 1 capsule by mouth daily.     No current facility-administered medications for this visit.     Family History  Problem Relation Age of Onset  . Hypertension Father   . Heart disease Father        MI at age 45  . Heart attack Father 33  . Diabetes Maternal Grandmother   . Heart failure Maternal Grandmother   .  Ovarian cancer Maternal Grandmother   . Hypertension Paternal Grandmother   . Hypertension Paternal Grandfather   . Breast cancer Maternal Aunt     ROS:  Pertinent items are noted in HPI.  Otherwise, a comprehensive ROS was negative.  Exam:   BP 120/84   Pulse 80   Resp 20   Ht 5' 4.5" (1.638 m)   Wt 184 lb (83.5 kg)   LMP 05/09/2013 Comment: supracervical  BMI 31.10 kg/m  Height: 5' 4.5" (163.8 cm) Ht Readings from Last 3 Encounters:  06/04/17 5' 4.5" (1.638 m)  05/30/17 5\' 5"  (1.651 m)  05/27/17 5' 4.75" (1.645 m)    General appearance: alert, cooperative and appears stated age, no sweating or change in skin color noted Head: Normocephalic, without obvious abnormality, atraumatic Neck: no adenopathy, supple, symmetrical, trachea midline and thyroid   Lungs: clear to auscultation bilaterally Heart: regular rate and rhythm, no irregular rate or murmur noted Extremities: extremities normal, atraumatic, no cyanosis or edema Skin: Skin color, texture, turgor normal. No rashes or lesions  Neurologic: Grossly normal   Pelvic:              Bimanual Exam:  Not done   A:         Here for Well woman exam  Recent TIA in past month  Complaining of vertigo and headache this am and SOB which resolved.  Complaining of radiating shoulder and slight chest pain   V/S stable, color good, no apparent distress  Needs ER evaluation    P:   Discussed with patient recommendation to end exam and be accompanied to ER by family. Patient agreeable and feel this is good decision. CMA with patient until transported. Declines EMS transport.

## 2017-06-04 NOTE — Telephone Encounter (Signed)
Referral put in by Dr. Leonie Man. Referral sent to Cardiology Rn.

## 2017-06-04 NOTE — ED Provider Notes (Signed)
[facility] EMERGENCY DEPARTMENT Provider Note   CSN: [id number] Arrival date & time: 06/04/17  1643     History   Chief Complaint Chief Complaint  Patient presents with  . Chest Pain  . Shortness of Breath    HPI Holly Moyer is a 42 y.o. female who presents with chest pain and shortness of breath.  Past medical history significant for recent TIA, headaches, hypertension.  Patient states that after she was hospitalized she is just felt generalized weakness and some mild shortness of breath.  She went back to work yesterday.  Today however she was in the shower and developed severe shortness of breath.  She had to go lie down and rest for 5-10 minutes to catch her breath.  After she was able to go to work.  While she was at work she developed dull chest pain over the left side of her chest.  She also has a dull ache in the left upper back.  This comes and goes.  Nothing makes it better or worse.  It does not radiate.  Severity of the pain is mild.  She has had this pain in the past.  She denies any significant cardiac or lung disease.  She does not smoke.  She denies fever, chills, URI symptoms, syncope, abdominal pain, nausea, vomiting, leg swelling.  She went to her OB/GYN's office today and told the provider about her symptoms who recommended her to come to the ED to be evaluated. She has an appointment with Cardiology coming up.  HPI  Past Medical History:  Diagnosis Date  . Abnormal Pap smear of cervix   . Allergy   . Anemia   . Breast mass in female 2010   questionable mass, no f/u (fibroadenoma vs papilloma)  . Headache(784.0)   . Hypertension   . Pneumonia    2008  . Stroke ([place])    05/23/2017  . Transfusion history     Patient Active Problem List   Diagnosis Date Noted  . Hypokalemia 05/24/2017  . TIA (transient ischemic attack) 05/23/2017  . Physical exam 12/27/2016  . HTN (hypertension) 06/27/2016  . Obesity (BMI 30.0-34.9) 06/27/2016  . S/P  hysterectomy 05/13/2013  . Menorrhagia 02/19/2013  . Chest pain 02/19/2013  . DOE (dyspnea on exertion) 02/19/2013  . MICROSCOPIC HEMATURIA 02/10/2009  . ACUTE CYSTITIS 12/31/2008  . BREAST MASS, RIGHT 06/30/2008  . OTHER ABSCESS OF VULVA 05/13/2008  . ANEMIA 01/16/2008    Past Surgical History:  Procedure Laterality Date  . ABDOMINAL HYSTERECTOMY  2015  . BILATERAL SALPINGECTOMY Bilateral 05/13/2013   Procedure: BILATERAL SALPINGECTOMY;  Surgeon: Azalia Bilis, MD;  Location: [facility];  Service: Gynecology;  Laterality: Bilateral;  . CESAREAN SECTION     x's 2  . COLPOSCOPY  2004  . CYSTO N/A 05/13/2013   Procedure: CYSTO;  Surgeon: Azalia Bilis, MD;  Location: [facility];  Service: Gynecology;  Laterality: N/A;  . SUPRACERVICAL ABDOMINAL HYSTERECTOMY N/A 05/13/2013   Procedure: HYSTERECTOMY SUPRACERVICAL ABDOMINAL;  Surgeon: Azalia Bilis, MD;  Location: [facility];  Service: Gynecology;  Laterality: N/A;  . WISDOM TOOTH EXTRACTION      OB History    Gravida Para Term Preterm AB Living   3 2   0 1 2   SAB TAB Ectopic Multiple Live Births   1       1       Home Medications    Prior to Admission medications   Medication Sig  Start Date End Date Taking? Authorizing Provider  acetaminophen (TYLENOL) 500 MG tablet Take 1,000 mg by mouth every 6 (six) hours as needed for moderate pain.    Yes [provider]  aspirin EC 81 MG tablet Take 81 mg by mouth daily.   Yes [provider]  atorvastatin (LIPITOR) 40 MG tablet Take 1 tablet (40 mg total) by mouth daily at 6 PM. 05/25/17  Yes Evangeline Gula, Wyatt Haste, MD  fexofenadine (ALLEGRA) 180 MG tablet Take 180 mg by mouth daily.   Yes [provider]  GARLIC PO Take 811 mg by mouth daily.    Yes [provider]  hydrochlorothiazide (HYDRODIURIL) 12.5 MG tablet Take 1 tablet (12.5 mg total) by mouth daily. 05/30/17  Yes Midge Minium, MD  ibuprofen (ADVIL,MOTRIN) 200 MG tablet Take 400 mg by mouth every  6 (six) hours as needed for moderate pain (pain).    Yes [provider]  Multiple Vitamin (MULTIVITAMIN) capsule Take 1 capsule by mouth daily.   Yes [provider]    Family History Family History  Problem Relation Age of Onset  . Hypertension Father   . Heart disease Father        MI at age 59  . Heart attack Father 109  . Diabetes Maternal Grandmother   . Heart failure Maternal Grandmother   . Ovarian cancer Maternal Grandmother   . Hypertension Paternal Grandmother   . Hypertension Paternal Grandfather   . Breast cancer Maternal Aunt     Social History Social History   Tobacco Use  . Smoking status: Never Smoker  . Smokeless tobacco: Never Used  Substance Use Topics  . Alcohol use: No  . Drug use: No     Allergies   Macrobid [nitrofurantoin macrocrystal]   Review of Systems Review of Systems  Constitutional: Negative for fever.  Respiratory: Positive for shortness of breath. Negative for cough and wheezing.   Cardiovascular: Positive for chest pain. Negative for palpitations and leg swelling.  Gastrointestinal: Negative for abdominal pain, nausea and vomiting.  Neurological: Positive for headaches. Negative for syncope and light-headedness.  All other systems reviewed and are negative.    Physical Exam Updated Vital Signs BP (!) 138/100 (BP Location: Left Arm)   Pulse 76   Temp 98.5 F (36.9 C) (Oral)   Resp 18   LMP 05/09/2013 Comment: supracervical  SpO2 99%   Physical Exam  Constitutional: She is oriented to person, place, and time. She appears well-developed and well-nourished. No distress.  HENT:  Head: Normocephalic and atraumatic.  Eyes: Conjunctivae are normal. Pupils are equal, round, and reactive to light. Right eye exhibits no discharge. Left eye exhibits no discharge. No scleral icterus.  Neck: Normal range of motion.  Cardiovascular: Normal rate and regular rhythm.  Pulmonary/Chest: Effort normal and breath sounds  normal. No respiratory distress. She exhibits no tenderness.  Abdominal: Soft. Bowel sounds are normal. She exhibits no distension. There is no tenderness.  Musculoskeletal:  No leg swelling or calf tenderness  Neurological: She is alert and oriented to person, place, and time.  Skin: Skin is warm and dry.  Psychiatric: She has a normal mood and affect. Her behavior is normal.  Nursing note and vitals reviewed.    ED Treatments / Results  Labs (all labs ordered are listed, but only abnormal results are displayed) Labs Reviewed  BASIC METABOLIC PANEL - Abnormal; Notable for the following components:      Result Value   Potassium 3.4 (*)  Creatinine, Ser 1.02 (*)    All other components within normal limits  CBC  D-DIMER, QUANTITATIVE (NOT AT [facility])  I-STAT TROPONIN, ED  I-STAT BETA HCG BLOOD, ED ([facility], [facility], [facility] ONLY)    EKG  EKG Interpretation  Date/Time:  Tuesday June 04 2017 16:54:07 EST Ventricular Rate:  87 PR Interval:  134 QRS Duration: 70 QT Interval:  346 QTC Calculation: 416 R Axis:   69 Text Interpretation:  Sinus rhythm with Premature atrial complexes Nonspecific T wave abnormality No significant change since last tracing Confirmed by Blanchie Dessert ([id number]) on 06/04/2017 7:29:53 PM       Radiology Dg Chest 2 View  Result Date: 06/04/2017 CLINICAL DATA:  Chest pain and dyspnea EXAM: CHEST - 2 VIEW COMPARISON:  05/14/2014 chest radiograph. FINDINGS: Stable cardiomediastinal silhouette with normal heart size. No pneumothorax. No pleural effusion. Lungs appear clear, with no acute consolidative airspace disease and no pulmonary edema. IMPRESSION: No active cardiopulmonary disease. Electronically Signed   By: Ilona Sorrel M.D.   On: 06/04/2017 18:08    Procedures Procedures (including critical care time)  Medications Ordered in ED Medications - No data to display   Initial Impression / Assessment and Plan / ED Course  I have reviewed the triage vital signs  and the nursing notes.  Pertinent labs & imaging results that were available during my care of the patient were reviewed by me and considered in my medical decision making (see chart for details).  42 year old female present with weakness, SOB, chest pain. Vitals are normal. Her exam is unremarkable. She wonders if it may be a medication side effect. She was recently started on HCTZ and Lipitor. Chest pain work up is reassuring. Doubt ACS, PE, pericarditis, esophageal rupture, tension pneumothorax, aortic dissection, cardiac tamponade. EKG is NSR and shows no significant change since last. CXR is negative. Initial troponin is 0. D dimer is normal. Labs are unremarkable. She walked in the hallway and stayed at 100% RA and breathing was unlabored. It's possible it may be a medication SE. I advised her to talk with her PCP. She verbalized understanding.   Final Clinical Impressions(s) / ED Diagnoses   Final diagnoses:  Nonspecific chest pain  Shortness of breath    ED Discharge Orders    None       Recardo Evangelist, PA-C 06/05/17 1546    Blanchie Dessert, MD 06/05/17 2042

## 2017-06-04 NOTE — Telephone Encounter (Signed)
Ok I will place it

## 2017-06-04 NOTE — Discharge Instructions (Signed)
Please call your doctor in the morning regarding your symptoms Return if worsening

## 2017-06-04 NOTE — ED Triage Notes (Signed)
Pt recently hospitialized for TIA reports SOB and dull chest pain, worsening with movement and inhalation.

## 2017-06-04 NOTE — ED Notes (Signed)
Pt ambulated on own ability. Sat maintained at 100% for ~50 steps. Pt feels and appears steady on feet.

## 2017-06-04 NOTE — ED Provider Notes (Signed)
Patient placed in Quick Look pathway, seen and evaluated   Chief Complaint: shortness of breath   HPI:   Pt states shortness of breath started this morning after she got out of shower. States associate chest tightness and fatigue. Symptoms are exertional. CP is pleuritic. Does not radiate. Denies extremity swelling. No recent travel or surgeries. No exagenous estrogens. Reports positive cardiac hx, father died from MI in 65s  ROS: positive for cp, sob, weakness, negative for palpitations, nausea, vomiting, abdominal pain.  Physical Exam:   Gen: No distress  Neuro: Awake and Alert  Skin: Warm    Focused Exam: Lungs clear to auscultation bilaterally, regular HR and rhythm. No peripheral swelling.   Pt with pleuritic chest pain, SOB, weakness, exertional dyspnea. WIll get labs including trop, CXR. Will get a ddimer. VS normal at this time. Pt in NAD.     Initiation of care has begun. The patient has been counseled on the process, plan, and necessity for staying for the completion/evaluation, and the remainder of the medical screening examination    Jeannett Senior, PA-C 06/04/17 [place], Gwenyth Allegra, MD 06/05/17 [phone]

## 2017-06-12 NOTE — Telephone Encounter (Signed)
Pt scheduled for next Thursday, 3/21,  at 12:20 w/ Amber.

## 2017-06-12 NOTE — Telephone Encounter (Signed)
Patsey Berthold, NP  Sent: Sat June 08, 2017 10:44 AM  To: Stanton Kidney, RN      Message   I was unable to get her again on Friday. Will you try her again Monday and offer appt with me or Allred when we are back in office to discuss? Thanks!

## 2017-06-13 ENCOUNTER — Encounter: Payer: Self-pay | Admitting: Family Medicine

## 2017-06-13 ENCOUNTER — Encounter: Payer: Self-pay | Admitting: General Practice

## 2017-06-13 ENCOUNTER — Ambulatory Visit (INDEPENDENT_AMBULATORY_CARE_PROVIDER_SITE_OTHER): Payer: 59 | Admitting: Family Medicine

## 2017-06-13 ENCOUNTER — Other Ambulatory Visit: Payer: Self-pay

## 2017-06-13 VITALS — BP 119/81 | HR 84 | Temp 98.3°F | Resp 16 | Ht 65.0 in | Wt 184.5 lb

## 2017-06-13 DIAGNOSIS — E785 Hyperlipidemia, unspecified: Secondary | ICD-10-CM

## 2017-06-13 DIAGNOSIS — I10 Essential (primary) hypertension: Secondary | ICD-10-CM | POA: Diagnosis not present

## 2017-06-13 LAB — BASIC METABOLIC PANEL
BUN: 17 mg/dL (ref 6–23)
CO2: 27 meq/L (ref 19–32)
Calcium: 10 mg/dL (ref 8.4–10.5)
Chloride: 104 mEq/L (ref 96–112)
Creatinine, Ser: 1.05 mg/dL (ref 0.40–1.20)
GFR: 74.4 mL/min (ref >60.00)
GLUCOSE: 81 mg/dL (ref 70–99)
POTASSIUM: 4.3 meq/L (ref 3.5–5.1)
SODIUM: 138 meq/L (ref 135–145)

## 2017-06-13 NOTE — Assessment & Plan Note (Signed)
Chronic problem.  Adequate control today.  Asymptomatic.  Encouraged her to take meds daily rather than PRN.  Check BMP due to HCTZ and hx of low K+.

## 2017-06-13 NOTE — Progress Notes (Signed)
   Subjective:    Patient ID: Holly Moyer, female    DOB: [birth year], 42 y.o.   MRN: [id number]  HPI HTN- ongoing issue.  Pt reports she will check BP in the AM and will 'only take HCTZ if needed'- taking 2-3x/week.  Yesterday was 130/94 and took BP pill.  Pt continued to have HAs even as BP normalized.  Pt did go to ER for CP and SOB- had normal cardiac workup  Hyperlipidemia- pt reports she had to stop Lipitor due to HAs and 'cloudy feeling'.  Pt feels much better off medication.  On Red Yeast Rice w/o difficulty.   Review of Systems For ROS see HPI     Objective:   Physical Exam  Constitutional: She is oriented to person, place, and time. She appears well-developed and well-nourished. No distress.  HENT:  Head: Normocephalic and atraumatic.  Eyes: Conjunctivae and EOM are normal. Pupils are equal, round, and reactive to light.  Neck: Normal range of motion. Neck supple. No thyromegaly present.  Cardiovascular: Normal rate, regular rhythm, normal heart sounds and intact distal pulses.  No murmur heard. Pulmonary/Chest: Effort normal and breath sounds normal. No respiratory distress.  Abdominal: Soft. She exhibits no distension. There is no tenderness.  Musculoskeletal: She exhibits no edema.  Lymphadenopathy:    She has no cervical adenopathy.  Neurological: She is alert and oriented to person, place, and time.  Skin: Skin is warm and dry.  Psychiatric: She has a normal mood and affect. Her behavior is normal.  Vitals reviewed.         Assessment & Plan:

## 2017-06-13 NOTE — Assessment & Plan Note (Signed)
Chronic problem.  Pt was intolerant of Lipitor but doing well on Red Yeast Rice.  Encouraged healthy diet and regular exercise.  Will continue to follow.

## 2017-06-13 NOTE — Patient Instructions (Signed)
Schedule your complete physical for late September We'll notify you of your lab results and make any changes if needed Start the HCTZ daily Keep up the good work on healthy diet and regular exercise- you look great! Call with any questions or concerns Happy Spring!!!

## 2017-06-19 NOTE — H&P (View-Only) (Signed)
ELECTROPHYSIOLOGY CONSULT NOTE  Patient ID: Holly Moyer MRN: [id number], DOB/AGE: 1976/01/21  Date of Consult: 06/20/2017  Primary Physician: Midge Minium, MD Primary Cardiologist: Oval Linsey Reason for Consultation: Cryptogenic stroke; recommendations regarding Implantable Loop Recorder  History of Present Illness EP has been asked to evaluate Holly Moyer for placement of an implantable loop recorder to monitor for atrial fibrillation by Dr Leonie Man.  The patient was admitted in February of 2019 with vision changes and unremarkable work up. Dr Leonie Man recommended outpatient TEE and ILR.   She has undergone workup for TIA including echocardiogram and carotid dopplers.  The patient was monitored on telemetry which has demonstrated sinus rhythm with no arrhythmias.    Echocardiogram 2/19 demonstrated EF 55-60%, no RWMA, LA 31.  Lab work is reviewed.  She has occasional palpitations. She also has had shortness of breath and chest pain that resolved with stopping statin.   Past Medical History:  Diagnosis Date  . Abnormal Pap smear of cervix   . Allergy   . Anemia   . Breast mass in female 2010   questionable mass, no f/u (fibroadenoma vs papilloma)  . Headache(784.0)   . Hypertension   . Pneumonia    2008  . Stroke ([place])    05/23/2017  . Transfusion history      Surgical History:  Past Surgical History:  Procedure Laterality Date  . ABDOMINAL HYSTERECTOMY  2015  . BILATERAL SALPINGECTOMY Bilateral 05/13/2013   Procedure: BILATERAL SALPINGECTOMY;  Surgeon: Azalia Bilis, MD;  Location: [facility];  Service: Gynecology;  Laterality: Bilateral;  . CESAREAN SECTION     x's 2  . COLPOSCOPY  2004  . CYSTO N/A 05/13/2013   Procedure: CYSTO;  Surgeon: Azalia Bilis, MD;  Location: [facility];  Service: Gynecology;  Laterality: N/A;  . SUPRACERVICAL ABDOMINAL HYSTERECTOMY N/A 05/13/2013   Procedure: HYSTERECTOMY SUPRACERVICAL ABDOMINAL;  Surgeon: Azalia Bilis, MD;  Location: [facility];  Service: Gynecology;  Laterality: N/A;  . WISDOM TOOTH EXTRACTION        (Not in a hospital admission)  Inpatient Medications:   Allergies:  Allergies  Allergen Reactions  . Lipitor [Atorvastatin Calcium] Other (See Comments)    Headaches, 'cloudy feeling'  . Macrobid [Nitrofurantoin Macrocrystal] Itching    Social History   Socioeconomic History  . Marital status: Single    Spouse name: Not on file  . Number of children: 2  . Years of education: Not on file  . Highest education level: Not on file  Occupational History  . Not on file  Social Needs  . Financial resource strain: Not on file  . Food insecurity:    Worry: Not on file    Inability: Not on file  . Transportation needs:    Medical: Not on file    Non-medical: Not on file  Tobacco Use  . Smoking status: Never Smoker  . Smokeless tobacco: Never Used  Substance and Sexual Activity  . Alcohol use: No  . Drug use: No  . Sexual activity: Never    Partners: Male    Birth control/protection: Surgical    Comment: supracervical hysterectomy/BSO  Lifestyle  . Physical activity:    Days per week: Not on file    Minutes per session: Not on file  . Stress: Not on file  Relationships  . Social connections:    Talks on phone: Not on file    Gets together: Not on file    Attends religious service: Not on  file    Active member of club or organization: Not on file    Attends meetings of clubs or organizations: Not on file    Relationship status: Not on file  . Intimate partner violence:    Fear of current or ex partner: Not on file    Emotionally abused: Not on file    Physically abused: Not on file    Forced sexual activity: Not on file  Other Topics Concern  . Not on file  Social History Narrative  . Not on file     Family History  Problem Relation Age of Onset  . Hypertension Father   . Heart disease Father        MI at age 42  . Heart attack Father 54  . Diabetes Maternal Grandmother   .  Heart failure Maternal Grandmother   . Ovarian cancer Maternal Grandmother   . Hypertension Paternal Grandmother   . Hypertension Paternal Grandfather   . Breast cancer Maternal Aunt       Review of Systems: All other systems reviewed and are otherwise negative except as noted above.  Physical Exam: Vitals:   06/20/17 1227  BP: 118/78  Pulse: 77  SpO2: 99%  Weight: 183 lb (83 kg)  Height: 5' 3.5" (1.613 m)    GEN- The patient is well appearing, alert and oriented x 3 today.   Head- normocephalic, atraumatic Eyes-  Sclera clear, conjunctiva pink Ears- hearing intact Oropharynx- clear Neck- supple Lungs- Clear to ausculation bilaterally, normal work of breathing Heart- Regular rate and rhythm GI- soft, NT, ND, + BS Extremities- no clubbing, cyanosis, or edema MS- no significant deformity or atrophy Skin- no rash or lesion Psych- euthymic mood, full affect   Labs:   Lab Results  Component Value Date   WBC 8.2 06/04/2017   HGB 14.3 06/04/2017   HCT 42.3 06/04/2017   MCV 86.9 06/04/2017   PLT 290 06/04/2017   No results for input(s): NA, K, CL, CO2, BUN, CREATININE, CALCIUM, PROT, BILITOT, ALKPHOS, ALT, AST, GLUCOSE in the last 168 hours.  Invalid input(s): LABALBU   Radiology/Studies: Ct Angio Head W Or Wo Contrast  Result Date: 05/23/2017 CLINICAL DATA:  Arterial stricture or occlusion. Right eye visual disturbance EXAM: CT ANGIOGRAPHY HEAD AND NECK TECHNIQUE: Multidetector CT imaging of the head and neck was performed using the standard protocol during bolus administration of intravenous contrast. Multiplanar CT image reconstructions and MIPs were obtained to evaluate the vascular anatomy. Carotid stenosis measurements (when applicable) are obtained utilizing NASCET criteria, using the distal internal carotid diameter as the denominator. CONTRAST:  166mL ISOVUE-370 IOPAMIDOL (ISOVUE-370) INJECTION 76% COMPARISON:  CT head 05/18/2014 FINDINGS: CT HEAD FINDINGS Brain:  No evidence of acute infarction, hemorrhage, hydrocephalus, extra-axial collection or mass lesion/mass effect. Vascular: Negative for hyperdense vessel Skull: Negative Sinuses: Negative Orbits: Negative Review of the MIP images confirms the above findings CTA NECK FINDINGS Aortic arch: Normal aortic arch and proximal great vessels Right carotid system: Normal right carotid without stenosis or dissection. No atherosclerotic disease Left carotid system: Normal left carotid without stenosis or dissection. Negative for atherosclerotic disease. Vertebral arteries: Both vertebral arteries are widely patent and normal. Skeleton: Negative Other neck: Negative Upper chest: Lung apices clear bilaterally. Review of the MIP images confirms the above findings CTA HEAD FINDINGS Anterior circulation: Cavernous carotid normal bilaterally. Anterior and middle cerebral arteries normal bilaterally. Posterior circulation: Both vertebral arteries patent to the basilar. Basilar normal. PICA, superior cerebellar, and posterior cerebral arteries normal  bilaterally. No stenosis or occlusion. Venous sinuses: Negative Anatomic variants: None Delayed phase: Normal enhancement on delayed imaging. Review of the MIP images confirms the above findings IMPRESSION: Negative CTA head and neck These results were called by telephone at the time of interpretation on 05/23/2017 at 3:19 pm to Dr. Gareth Morgan , who verbally acknowledged these results. Electronically Signed   By: Franchot Gallo M.D.   On: 05/23/2017 15:19   Dg Chest 2 View  Result Date: 06/04/2017 CLINICAL DATA:  Chest pain and dyspnea EXAM: CHEST - 2 VIEW COMPARISON:  05/14/2014 chest radiograph. FINDINGS: Stable cardiomediastinal silhouette with normal heart size. No pneumothorax. No pleural effusion. Lungs appear clear, with no acute consolidative airspace disease and no pulmonary edema. IMPRESSION: No active cardiopulmonary disease. Electronically Signed   By: Ilona Sorrel M.D.    On: 06/04/2017 18:08   Ct Angio Neck W And/or Wo Contrast  Result Date: 05/23/2017 CLINICAL DATA:  Arterial stricture or occlusion. Right eye visual disturbance EXAM: CT ANGIOGRAPHY HEAD AND NECK TECHNIQUE: Multidetector CT imaging of the head and neck was performed using the standard protocol during bolus administration of intravenous contrast. Multiplanar CT image reconstructions and MIPs were obtained to evaluate the vascular anatomy. Carotid stenosis measurements (when applicable) are obtained utilizing NASCET criteria, using the distal internal carotid diameter as the denominator. CONTRAST:  179mL ISOVUE-370 IOPAMIDOL (ISOVUE-370) INJECTION 76% COMPARISON:  CT head 05/18/2014 FINDINGS: CT HEAD FINDINGS Brain: No evidence of acute infarction, hemorrhage, hydrocephalus, extra-axial collection or mass lesion/mass effect. Vascular: Negative for hyperdense vessel Skull: Negative Sinuses: Negative Orbits: Negative Review of the MIP images confirms the above findings CTA NECK FINDINGS Aortic arch: Normal aortic arch and proximal great vessels Right carotid system: Normal right carotid without stenosis or dissection. No atherosclerotic disease Left carotid system: Normal left carotid without stenosis or dissection. Negative for atherosclerotic disease. Vertebral arteries: Both vertebral arteries are widely patent and normal. Skeleton: Negative Other neck: Negative Upper chest: Lung apices clear bilaterally. Review of the MIP images confirms the above findings CTA HEAD FINDINGS Anterior circulation: Cavernous carotid normal bilaterally. Anterior and middle cerebral arteries normal bilaterally. Posterior circulation: Both vertebral arteries patent to the basilar. Basilar normal. PICA, superior cerebellar, and posterior cerebral arteries normal bilaterally. No stenosis or occlusion. Venous sinuses: Negative Anatomic variants: None Delayed phase: Normal enhancement on delayed imaging. Review of the MIP images confirms  the above findings IMPRESSION: Negative CTA head and neck These results were called by telephone at the time of interpretation on 05/23/2017 at 3:19 pm to Dr. Gareth Morgan , who verbally acknowledged these results. Electronically Signed   By: Franchot Gallo M.D.   On: 05/23/2017 15:19   Mr Brain Wo Contrast  Result Date: 05/24/2017 CLINICAL DATA:  Right-sided visual disturbance and transient confusion. Headache. EXAM: MRI HEAD WITHOUT CONTRAST TECHNIQUE: Multiplanar, multiecho pulse sequences of the brain and surrounding structures were obtained without intravenous contrast. COMPARISON:  CT 05/23/2017 FINDINGS: Brain: There is restricted diffusion and increased FLAIR and T2 signal associated with the hippocampus on the left. No mass effect. No hemorrhage. The remainder of the brain appears normal without evidence of old infarction, mass lesion, hemorrhage, hydrocephalus or extra-axial collection. Vascular: Major vessels at the base of the brain show flow. Skull and upper cervical spine: Negative Sinuses/Orbits: Clear/normal Other: None IMPRESSION: Abnormal restricted diffusion and T2/FLAIR signal associated with the left hippocampus. No mass effect or hemorrhage. Whereas this could be an acute ischemic infarction, that is unlikely at this age. This pattern  of finding can be seen with transient global amnesia, in postictal states and with limbic encephalitis. Electronically Signed   By: Nelson Chimes M.D.   On: 05/24/2017 13:26    All prior EKG's in EPIC reviewed with no documented atrial fibrillation  Assessment and Plan:  1. Cryptogenic stroke The patient presents with cryptogenic stroke.  TEE and ILR have been recommended by Dr Leonie Man.  I spoke at length with the patient about monitoring for afib with an implantable loop recorder.  Risks, benefits, and alteratives to implantable loop recorder were discussed with the patient today.   Risks, benefits to TEE reviewed with the patient.    She would like  to proceed with TEE at this time. She would like to think about ILR implant. I have advised her to let us know morning of TEE if she would like to proceed with ILR - Dr Rayann Heman in the hospital day of TEE.     Chanetta Marshall, NP 06/20/2017 7:28 PM

## 2017-06-19 NOTE — Progress Notes (Signed)
ELECTROPHYSIOLOGY CONSULT NOTE  Patient ID: Holly Moyer MRN: [id number], DOB/AGE: November 30, 1975  Date of Consult: 06/20/2017  Primary Physician: Midge Minium, MD Primary Cardiologist: Oval Linsey Reason for Consultation: Cryptogenic stroke; recommendations regarding Implantable Loop Recorder  History of Present Illness EP has been asked to evaluate Holly Moyer for placement of an implantable loop recorder to monitor for atrial fibrillation by Dr Leonie Man.  The patient was admitted in February of 2019 with vision changes and unremarkable work up. Dr Leonie Man recommended outpatient TEE and ILR.   She has undergone workup for TIA including echocardiogram and carotid dopplers.  The patient was monitored on telemetry which has demonstrated sinus rhythm with no arrhythmias.    Echocardiogram 2/19 demonstrated EF 55-60%, no RWMA, LA 31.  Lab work is reviewed.  She has occasional palpitations. She also has had shortness of breath and chest pain that resolved with stopping statin.   Past Medical History:  Diagnosis Date  . Abnormal Pap smear of cervix   . Allergy   . Anemia   . Breast mass in female 2010   questionable mass, no f/u (fibroadenoma vs papilloma)  . Headache(784.0)   . Hypertension   . Pneumonia    2008  . Stroke ([place])    05/23/2017  . Transfusion history      Surgical History:  Past Surgical History:  Procedure Laterality Date  . ABDOMINAL HYSTERECTOMY  2015  . BILATERAL SALPINGECTOMY Bilateral 05/13/2013   Procedure: BILATERAL SALPINGECTOMY;  Surgeon: Azalia Bilis, MD;  Location: [facility];  Service: Gynecology;  Laterality: Bilateral;  . CESAREAN SECTION     x's 2  . COLPOSCOPY  2004  . CYSTO N/A 05/13/2013   Procedure: CYSTO;  Surgeon: Azalia Bilis, MD;  Location: [facility];  Service: Gynecology;  Laterality: N/A;  . SUPRACERVICAL ABDOMINAL HYSTERECTOMY N/A 05/13/2013   Procedure: HYSTERECTOMY SUPRACERVICAL ABDOMINAL;  Surgeon: Azalia Bilis, MD;  Location: [facility];  Service: Gynecology;  Laterality: N/A;  . WISDOM TOOTH EXTRACTION        (Not in a hospital admission)  Inpatient Medications:   Allergies:  Allergies  Allergen Reactions  . Lipitor [Atorvastatin Calcium] Other (See Comments)    Headaches, 'cloudy feeling'  . Macrobid [Nitrofurantoin Macrocrystal] Itching    Social History   Socioeconomic History  . Marital status: Single    Spouse name: Not on file  . Number of children: 2  . Years of education: Not on file  . Highest education level: Not on file  Occupational History  . Not on file  Social Needs  . Financial resource strain: Not on file  . Food insecurity:    Worry: Not on file    Inability: Not on file  . Transportation needs:    Medical: Not on file    Non-medical: Not on file  Tobacco Use  . Smoking status: Never Smoker  . Smokeless tobacco: Never Used  Substance and Sexual Activity  . Alcohol use: No  . Drug use: No  . Sexual activity: Never    Partners: Male    Birth control/protection: Surgical    Comment: supracervical hysterectomy/BSO  Lifestyle  . Physical activity:    Days per week: Not on file    Minutes per session: Not on file  . Stress: Not on file  Relationships  . Social connections:    Talks on phone: Not on file    Gets together: Not on file    Attends religious service: Not on  file    Active member of club or organization: Not on file    Attends meetings of clubs or organizations: Not on file    Relationship status: Not on file  . Intimate partner violence:    Fear of current or ex partner: Not on file    Emotionally abused: Not on file    Physically abused: Not on file    Forced sexual activity: Not on file  Other Topics Concern  . Not on file  Social History Narrative  . Not on file     Family History  Problem Relation Age of Onset  . Hypertension Father   . Heart disease Father        MI at age 31  . Heart attack Father 59  . Diabetes Maternal Grandmother   .  Heart failure Maternal Grandmother   . Ovarian cancer Maternal Grandmother   . Hypertension Paternal Grandmother   . Hypertension Paternal Grandfather   . Breast cancer Maternal Aunt       Review of Systems: All other systems reviewed and are otherwise negative except as noted above.  Physical Exam: Vitals:   06/20/17 1227  BP: 118/78  Pulse: 77  SpO2: 99%  Weight: 183 lb (83 kg)  Height: 5' 3.5" (1.613 m)    GEN- The patient is well appearing, alert and oriented x 3 today.   Head- normocephalic, atraumatic Eyes-  Sclera clear, conjunctiva pink Ears- hearing intact Oropharynx- clear Neck- supple Lungs- Clear to ausculation bilaterally, normal work of breathing Heart- Regular rate and rhythm GI- soft, NT, ND, + BS Extremities- no clubbing, cyanosis, or edema MS- no significant deformity or atrophy Skin- no rash or lesion Psych- euthymic mood, full affect   Labs:   Lab Results  Component Value Date   WBC 8.2 06/04/2017   HGB 14.3 06/04/2017   HCT 42.3 06/04/2017   MCV 86.9 06/04/2017   PLT 290 06/04/2017   No results for input(s): NA, K, CL, CO2, BUN, CREATININE, CALCIUM, PROT, BILITOT, ALKPHOS, ALT, AST, GLUCOSE in the last 168 hours.  Invalid input(s): LABALBU   Radiology/Studies: Ct Angio Head W Or Wo Contrast  Result Date: 05/23/2017 CLINICAL DATA:  Arterial stricture or occlusion. Right eye visual disturbance EXAM: CT ANGIOGRAPHY HEAD AND NECK TECHNIQUE: Multidetector CT imaging of the head and neck was performed using the standard protocol during bolus administration of intravenous contrast. Multiplanar CT image reconstructions and MIPs were obtained to evaluate the vascular anatomy. Carotid stenosis measurements (when applicable) are obtained utilizing NASCET criteria, using the distal internal carotid diameter as the denominator. CONTRAST:  166mL ISOVUE-370 IOPAMIDOL (ISOVUE-370) INJECTION 76% COMPARISON:  CT head 05/18/2014 FINDINGS: CT HEAD FINDINGS Brain:  No evidence of acute infarction, hemorrhage, hydrocephalus, extra-axial collection or mass lesion/mass effect. Vascular: Negative for hyperdense vessel Skull: Negative Sinuses: Negative Orbits: Negative Review of the MIP images confirms the above findings CTA NECK FINDINGS Aortic arch: Normal aortic arch and proximal great vessels Right carotid system: Normal right carotid without stenosis or dissection. No atherosclerotic disease Left carotid system: Normal left carotid without stenosis or dissection. Negative for atherosclerotic disease. Vertebral arteries: Both vertebral arteries are widely patent and normal. Skeleton: Negative Other neck: Negative Upper chest: Lung apices clear bilaterally. Review of the MIP images confirms the above findings CTA HEAD FINDINGS Anterior circulation: Cavernous carotid normal bilaterally. Anterior and middle cerebral arteries normal bilaterally. Posterior circulation: Both vertebral arteries patent to the basilar. Basilar normal. PICA, superior cerebellar, and posterior cerebral arteries normal  bilaterally. No stenosis or occlusion. Venous sinuses: Negative Anatomic variants: None Delayed phase: Normal enhancement on delayed imaging. Review of the MIP images confirms the above findings IMPRESSION: Negative CTA head and neck These results were called by telephone at the time of interpretation on 05/23/2017 at 3:19 pm to Dr. Gareth Morgan , who verbally acknowledged these results. Electronically Signed   By: Franchot Gallo M.D.   On: 05/23/2017 15:19   Dg Chest 2 View  Result Date: 06/04/2017 CLINICAL DATA:  Chest pain and dyspnea EXAM: CHEST - 2 VIEW COMPARISON:  05/14/2014 chest radiograph. FINDINGS: Stable cardiomediastinal silhouette with normal heart size. No pneumothorax. No pleural effusion. Lungs appear clear, with no acute consolidative airspace disease and no pulmonary edema. IMPRESSION: No active cardiopulmonary disease. Electronically Signed   By: Ilona Sorrel M.D.    On: 06/04/2017 18:08   Ct Angio Neck W And/or Wo Contrast  Result Date: 05/23/2017 CLINICAL DATA:  Arterial stricture or occlusion. Right eye visual disturbance EXAM: CT ANGIOGRAPHY HEAD AND NECK TECHNIQUE: Multidetector CT imaging of the head and neck was performed using the standard protocol during bolus administration of intravenous contrast. Multiplanar CT image reconstructions and MIPs were obtained to evaluate the vascular anatomy. Carotid stenosis measurements (when applicable) are obtained utilizing NASCET criteria, using the distal internal carotid diameter as the denominator. CONTRAST:  128mL ISOVUE-370 IOPAMIDOL (ISOVUE-370) INJECTION 76% COMPARISON:  CT head 05/18/2014 FINDINGS: CT HEAD FINDINGS Brain: No evidence of acute infarction, hemorrhage, hydrocephalus, extra-axial collection or mass lesion/mass effect. Vascular: Negative for hyperdense vessel Skull: Negative Sinuses: Negative Orbits: Negative Review of the MIP images confirms the above findings CTA NECK FINDINGS Aortic arch: Normal aortic arch and proximal great vessels Right carotid system: Normal right carotid without stenosis or dissection. No atherosclerotic disease Left carotid system: Normal left carotid without stenosis or dissection. Negative for atherosclerotic disease. Vertebral arteries: Both vertebral arteries are widely patent and normal. Skeleton: Negative Other neck: Negative Upper chest: Lung apices clear bilaterally. Review of the MIP images confirms the above findings CTA HEAD FINDINGS Anterior circulation: Cavernous carotid normal bilaterally. Anterior and middle cerebral arteries normal bilaterally. Posterior circulation: Both vertebral arteries patent to the basilar. Basilar normal. PICA, superior cerebellar, and posterior cerebral arteries normal bilaterally. No stenosis or occlusion. Venous sinuses: Negative Anatomic variants: None Delayed phase: Normal enhancement on delayed imaging. Review of the MIP images confirms  the above findings IMPRESSION: Negative CTA head and neck These results were called by telephone at the time of interpretation on 05/23/2017 at 3:19 pm to Dr. Gareth Morgan , who verbally acknowledged these results. Electronically Signed   By: Franchot Gallo M.D.   On: 05/23/2017 15:19   Mr Brain Wo Contrast  Result Date: 05/24/2017 CLINICAL DATA:  Right-sided visual disturbance and transient confusion. Headache. EXAM: MRI HEAD WITHOUT CONTRAST TECHNIQUE: Multiplanar, multiecho pulse sequences of the brain and surrounding structures were obtained without intravenous contrast. COMPARISON:  CT 05/23/2017 FINDINGS: Brain: There is restricted diffusion and increased FLAIR and T2 signal associated with the hippocampus on the left. No mass effect. No hemorrhage. The remainder of the brain appears normal without evidence of old infarction, mass lesion, hemorrhage, hydrocephalus or extra-axial collection. Vascular: Major vessels at the base of the brain show flow. Skull and upper cervical spine: Negative Sinuses/Orbits: Clear/normal Other: None IMPRESSION: Abnormal restricted diffusion and T2/FLAIR signal associated with the left hippocampus. No mass effect or hemorrhage. Whereas this could be an acute ischemic infarction, that is unlikely at this age. This pattern  of finding can be seen with transient global amnesia, in postictal states and with limbic encephalitis. Electronically Signed   By: Nelson Chimes M.D.   On: 05/24/2017 13:26    All prior EKG's in EPIC reviewed with no documented atrial fibrillation  Assessment and Plan:  1. Cryptogenic stroke The patient presents with cryptogenic stroke.  TEE and ILR have been recommended by Dr Leonie Man.  I spoke at length with the patient about monitoring for afib with an implantable loop recorder.  Risks, benefits, and alteratives to implantable loop recorder were discussed with the patient today.   Risks, benefits to TEE reviewed with the patient.    She would like  to proceed with TEE at this time. She would like to think about ILR implant. I have advised her to let us know morning of TEE if she would like to proceed with ILR - Dr Rayann Heman in the hospital day of TEE.     Chanetta Marshall, NP 06/20/2017 7:28 PM

## 2017-06-20 ENCOUNTER — Telehealth: Payer: Self-pay | Admitting: Family Medicine

## 2017-06-20 ENCOUNTER — Encounter: Payer: Self-pay | Admitting: Nurse Practitioner

## 2017-06-20 ENCOUNTER — Telehealth: Payer: Self-pay | Admitting: Nurse Practitioner

## 2017-06-20 ENCOUNTER — Ambulatory Visit (INDEPENDENT_AMBULATORY_CARE_PROVIDER_SITE_OTHER): Payer: 59 | Admitting: Nurse Practitioner

## 2017-06-20 ENCOUNTER — Encounter: Payer: Self-pay | Admitting: *Deleted

## 2017-06-20 VITALS — BP 118/78 | HR 77 | Ht 63.5 in | Wt 183.0 lb

## 2017-06-20 DIAGNOSIS — G459 Transient cerebral ischemic attack, unspecified: Secondary | ICD-10-CM | POA: Diagnosis not present

## 2017-06-20 NOTE — Telephone Encounter (Signed)
Pt dropped off "Sit/Stand [employer] Form", asked to be called when completed. Placed in front bin w/ charge sheet.

## 2017-06-20 NOTE — Patient Instructions (Signed)
Medication Instructions:   Your physician recommends that you continue on your current medications as directed. Please refer to the Current Medication list given to you today.   If you need a refill on your cardiac medications before your next appointment, please call your pharmacy.  Labwork:  CBC AND BMET TODAY    Testing/Procedures:  SEE LETTER FOR TEE ON 06-25-17 WITH DR Meda Coffee   Follow-Up:  BASED UPON PT REQUEST    Any Other Special Instructions Will Be Listed Below (If Applicable).

## 2017-06-20 NOTE — Telephone Encounter (Signed)
New message     Patient called and wanted to let you know that she will have the loop recorder placed on 06/25/17

## 2017-06-21 LAB — CBC
Hematocrit: 40.3 % (ref 34.0–46.6)
Hemoglobin: 13.2 g/dL (ref 11.1–15.9)
MCH: 29.4 pg (ref 26.6–33.0)
MCHC: 32.8 g/dL (ref 31.5–35.7)
MCV: 90 fL (ref 79–97)
PLATELETS: 255 10*3/uL (ref 150–379)
RBC: 4.49 x10E6/uL (ref 3.77–5.28)
RDW: 13.7 % (ref 12.3–15.4)
WBC: 7.1 10*3/uL (ref 3.4–10.8)

## 2017-06-21 LAB — BASIC METABOLIC PANEL
BUN/Creatinine Ratio: 13 (ref 9–23)
BUN: 14 mg/dL (ref 6–24)
CALCIUM: 9.9 mg/dL (ref 8.7–10.2)
CO2: 24 mmol/L (ref 20–29)
CREATININE: 1.07 mg/dL — AB (ref 0.57–1.00)
Chloride: 102 mmol/L (ref 96–106)
GFR calc Af Amer: 75 mL/min/{1.73_m2} (ref >59)
GFR calc non Af Amer: 65 mL/min/{1.73_m2} (ref >59)
GLUCOSE: 92 mg/dL (ref 65–99)
POTASSIUM: 4.2 mmol/L (ref 3.5–5.2)
Sodium: 140 mmol/L (ref 134–144)

## 2017-06-24 NOTE — Telephone Encounter (Signed)
LMOVM to inform patient; paperwork that was dropped off has been completed, waiting in front office for pick up.

## 2017-06-24 NOTE — Telephone Encounter (Signed)
Paperwork completed. Will inform pt.

## 2017-06-25 ENCOUNTER — Encounter (HOSPITAL_COMMUNITY)
Admission: RE | Disposition: A | Discharge status: Home / Self Care | Payer: Self-pay | Source: Ambulatory Visit | Attending: Cardiology

## 2017-06-25 ENCOUNTER — Encounter (HOSPITAL_COMMUNITY): Payer: Self-pay

## 2017-06-25 ENCOUNTER — Other Ambulatory Visit: Payer: Self-pay

## 2017-06-25 ENCOUNTER — Ambulatory Visit (HOSPITAL_BASED_OUTPATIENT_CLINIC_OR_DEPARTMENT_OTHER)
Admission: RE | Admit: 2017-06-25 | Discharge: 2017-06-25 | Disposition: A | Discharge status: Home / Self Care | Payer: 59 | Source: Ambulatory Visit | Attending: Physician Assistant | Admitting: Physician Assistant

## 2017-06-25 ENCOUNTER — Ambulatory Visit (HOSPITAL_COMMUNITY)
Admission: RE | Admit: 2017-06-25 | Discharge: 2017-06-25 | Disposition: A | Discharge status: Home / Self Care | Payer: 59 | Source: Ambulatory Visit | Attending: Cardiology | Admitting: Cardiology

## 2017-06-25 DIAGNOSIS — I361 Nonrheumatic tricuspid (valve) insufficiency: Secondary | ICD-10-CM | POA: Diagnosis not present

## 2017-06-25 DIAGNOSIS — I10 Essential (primary) hypertension: Secondary | ICD-10-CM | POA: Insufficient documentation

## 2017-06-25 DIAGNOSIS — Z8249 Family history of ischemic heart disease and other diseases of the circulatory system: Secondary | ICD-10-CM | POA: Diagnosis not present

## 2017-06-25 DIAGNOSIS — I6389 Other cerebral infarction: Secondary | ICD-10-CM | POA: Insufficient documentation

## 2017-06-25 HISTORY — PX: TEE WITHOUT CARDIOVERSION: SHX5443

## 2017-06-25 HISTORY — PX: LOOP RECORDER INSERTION: EP1214

## 2017-06-25 SURGERY — LOOP RECORDER INSERTION
Anesthesia: LOCAL

## 2017-06-25 SURGERY — ECHOCARDIOGRAM, TRANSESOPHAGEAL
Anesthesia: Moderate Sedation

## 2017-06-25 MED ORDER — LIDOCAINE-EPINEPHRINE 1 %-1:100000 IJ SOLN
INTRAMUSCULAR | Status: DC | PRN
  Administered 2017-06-25: 25 mL

## 2017-06-25 MED ORDER — MIDAZOLAM HCL 10 MG/2ML IJ SOLN
INTRAMUSCULAR | Status: DC | PRN
  Administered 2017-06-25 (×2): 2 mg via INTRAVENOUS

## 2017-06-25 MED ORDER — LIDOCAINE-EPINEPHRINE 1 %-1:100000 IJ SOLN
INTRAMUSCULAR | Status: AC
  Filled 2017-06-25: qty 1

## 2017-06-25 MED ORDER — MIDAZOLAM HCL 5 MG/ML IJ SOLN
INTRAMUSCULAR | Status: AC
  Filled 2017-06-25: qty 2

## 2017-06-25 MED ORDER — FENTANYL CITRATE (PF) 100 MCG/2ML IJ SOLN
INTRAMUSCULAR | Status: DC | PRN
  Administered 2017-06-25: 50 ug via INTRAVENOUS
  Administered 2017-06-25: 25 ug via INTRAVENOUS

## 2017-06-25 MED ORDER — SODIUM CHLORIDE 0.9 % IV SOLN
INTRAVENOUS | Status: DC
Start: 2017-06-25 — End: 2017-06-25
  Administered 2017-06-25: 09:00:00 via INTRAVENOUS

## 2017-06-25 MED ORDER — FENTANYL CITRATE (PF) 100 MCG/2ML IJ SOLN
INTRAMUSCULAR | Status: AC
  Filled 2017-06-25: qty 2

## 2017-06-25 MED ORDER — BUTAMBEN-TETRACAINE-BENZOCAINE 2-2-14 % EX AERO
INHALATION_SPRAY | CUTANEOUS | Status: DC | PRN
  Administered 2017-06-25: 2 via TOPICAL

## 2017-06-25 MED ORDER — SODIUM CHLORIDE 0.9 % IV SOLN: INTRAVENOUS | Status: DC

## 2017-06-25 SURGICAL SUPPLY — 2 items
LOOP REVEAL LINQSYS (Prosthesis & Implant Heart) ×2 IMPLANT
PACK LOOP INSERTION (CUSTOM PROCEDURE TRAY) ×2 IMPLANT

## 2017-06-25 NOTE — Progress Notes (Signed)
  Echocardiogram Echocardiogram Transesophageal has been performed.  Holly Moyer 06/25/2017, 10:56 AM

## 2017-06-25 NOTE — CV Procedure (Signed)
     Transesophageal Echocardiogram Note  Holly Moyer [id number] Aug 21, 1975  Procedure: Transesophageal Echocardiogram Indications: TIA  Procedure Details Consent: Obtained Time Out: Verified patient identification, verified procedure, site/side was marked, verified correct patient position, special equipment/implants available, Radiology Safety Procedures followed,  medications/allergies/relevent history reviewed, required imaging and test results available.  Performed  Medications: During this procedure the patient is administered a total of Versed 4 mg and Fentanyl 75 mcg to achieve and maintain moderate conscious sedation.  The patient's heart rate, blood pressure, and oxygen saturation are monitored continuously during the procedure. The period of conscious sedation is 30 minutes, of which I was present face-to-face 100% of this time.  - No cardiac source of emboli was indentified.  Complications: No apparent complications Patient did tolerate procedure well.  [name], MD, [facility] 06/25/2017, 10:22 AM

## 2017-06-25 NOTE — Discharge Instructions (Signed)
Implant site care instructions Keep incision clean and dry for 3 days. You can remove outer dressing tomorrow. Leave steri-strips (little pieces of tape) on until seen in the office for wound check appointment. Call the office [phone]) for redness, drainage, swelling, or fever.     Transesophageal Echocardiogram Transesophageal echocardiography (TEE) is a picture test of your heart using sound waves. The pictures taken can give very detailed pictures of your heart. This can help your doctor see if there are problems with your heart. TEE can check:  If your heart has blood clots in it.  How well your heart valves are working.  If you have an infection on the inside of your heart.  Some of the major arteries of your heart.  If your heart valve is working after a Office manager.  Your heart before a procedure that uses a shock to your heart to get the rhythm back to normal.  What happens before the procedure?  Do not eat or drink for 6 hours before the procedure or as told by your doctor.  Make plans to have someone drive you home after the procedure. Do not drive yourself home.  An IV tube will be put in your arm. What happens during the procedure?  You will be given a medicine to help you relax (sedative). It will be given through the IV tube.  A numbing medicine will be sprayed or gargled in the back of your throat to help numb it.  The tip of the probe is placed into the back of your mouth. You will be asked to swallow. This helps to pass the probe into your esophagus.  Once the tip of the probe is in the right place, your doctor can take pictures of your heart.  You may feel pressure at the back of your throat. What happens after the procedure?  You will be taken to a recovery area so the sedative can wear off.  Your throat may be sore and scratchy. This will go away slowly over time.  You will go home when you are fully awake and able to swallow liquids.  You should  have someone stay with you for the next 24 hours.  Do not drive or operate machinery for the next 24 hours. This information is not intended to replace advice given to you by your health care provider. Make sure you discuss any questions you have with your health care provider. Document Released: 01/14/2009 Document Revised: 08/25/2015 Document Reviewed: 09/18/2012 Elsevier Interactive Patient Education  2018 [employer].  TEE  YOU HAD AN CARDIAC PROCEDURE TODAY: Refer to the procedure report and other information in the discharge instructions given to you for any specific questions about what was found during the examination. If this information does not answer your questions, please call [facility] office at [phone] to clarify.   DIET: Your first meal following the procedure should be a light meal and then it is ok to progress to your normal diet. A half-sandwich or bowl of soup is an example of a good first meal. Heavy or fried foods are harder to digest and may make you feel nauseous or bloated. Drink plenty of fluids but you should avoid alcoholic beverages for 24 hours. If you had a esophageal dilation, please see attached instructions for diet.   ACTIVITY: Your care partner should take you home directly after the procedure. You should plan to take it easy, moving slowly for the rest of the day. You can resume normal  activity the day after the procedure however YOU SHOULD NOT DRIVE, use power tools, machinery or perform tasks that involve climbing or major physical exertion for 24 hours (because of the sedation medicines used during the test).   SYMPTOMS TO REPORT IMMEDIATELY: A cardiologist can be reached at any hour. Please call [phone] for any of the following symptoms:  Vomiting of blood or coffee ground material  New, significant abdominal pain  New, significant chest pain or pain under the shoulder blades  Painful or persistently difficult swallowing  New shortness  of breath  Black, tarry-looking or red, bloody stools  FOLLOW UP:  Please also call with any specific questions about appointments or follow up tests.

## 2017-06-25 NOTE — Interval H&P Note (Signed)
History and Physical Interval Note:  06/25/2017 9:49 AM  Holly Moyer  has presented today for surgery, with the diagnosis of TIA  The various methods of treatment have been discussed with the patient and family. After consideration of risks, benefits and other options for treatment, the patient has consented to  Procedure(s): LOOP RECORDER INSERTION (N/A) as a surgical intervention .  The patient's history has been reviewed, patient examined, no change in status, stable for surgery.  I have reviewed the patient's chart and labs.  Questions were answered to the patient's satisfaction.    Cryptogenic stroke The patient presents with cryptogenic stroke.  The patient has a TEE planned for this AM.  I spoke at length with the patient about monitoring for afib with either a 30 day event monitor or an implantable loop recorder.  Risks, benefits, and alteratives to implantable loop recorder were discussed with the patient today.   At this time, the patient is very clear in their decision to proceed with implantable loop recorder.   Please call with questions.    Holly Moyer

## 2017-06-25 NOTE — Interval H&P Note (Signed)
History and Physical Interval Note:  06/25/2017 8:49 AM  Holly Moyer  has presented today for surgery, with the diagnosis of TIA  The various methods of treatment have been discussed with the patient and family. After consideration of risks, benefits and other options for treatment, the patient has consented to  Procedure(s): TRANSESOPHAGEAL ECHOCARDIOGRAM (TEE) (N/A) as a surgical intervention .  The patient's history has been reviewed, patient examined, no change in status, stable for surgery.  I have reviewed the patient's chart and labs.  Questions were answered to the patient's satisfaction.     [name]

## 2017-06-26 ENCOUNTER — Encounter (HOSPITAL_COMMUNITY): Payer: Self-pay | Admitting: Cardiology

## 2017-07-02 ENCOUNTER — Encounter: Payer: Self-pay | Admitting: Adult Health

## 2017-07-02 ENCOUNTER — Telehealth: Payer: Self-pay | Admitting: Adult Health

## 2017-07-02 ENCOUNTER — Ambulatory Visit (INDEPENDENT_AMBULATORY_CARE_PROVIDER_SITE_OTHER): Payer: 59 | Admitting: Adult Health

## 2017-07-02 VITALS — BP 136/86 | HR 81 | Ht 63.5 in | Wt 183.4 lb

## 2017-07-02 DIAGNOSIS — I10 Essential (primary) hypertension: Secondary | ICD-10-CM | POA: Diagnosis not present

## 2017-07-02 DIAGNOSIS — E785 Hyperlipidemia, unspecified: Secondary | ICD-10-CM

## 2017-07-02 DIAGNOSIS — I639 Cerebral infarction, unspecified: Secondary | ICD-10-CM

## 2017-07-02 MED ORDER — ASPIRIN EC 325 MG PO TBEC: 325.0000 mg | DELAYED_RELEASE_TABLET | Freq: Every day | ORAL | 0 refills | Status: AC

## 2017-07-02 NOTE — Progress Notes (Signed)
[facility] [address] [place]. [place] [address] [phone]       OFFICE FOLLOW UP NOTE  Holly Moyer Date of Birth:  1975-08-11 Medical Record Number:  [id number]   Reason for Referral:  hospital stroke follow up  CHIEF COMPLAINT:  Chief Complaint  Patient presents with  . 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 History obtained from patient and chart review. Reviewed all radiology images and labs personally.  Holly Moyer is a 42 year old female PMH of hypertension who presented with visual change and confusion on 05/23/2017.  She stated in the ED that when she was looking in her friend, she was unable to see her face she could see her body.  She states that the onset was abrupt with a progressive loss of vision.  Denied weakness or numbness.  She does describe that she was slightly disoriented and confused, having some difficulty with driving.  Over the course of 1-2 hours, symptoms gradually improved and are currently resolved the exception of mild headache at the center of the head.  Denied photophobia.  She does get headaches from time to time, but has no history of spots in her vision, tingling, or any other neurological symptoms of headaches in the past.  She denies strokes or strokelike symptoms in the past.  CTA head and neck was negative for large vessel occlusion.  MRI brain reviewed and did show abnormal restricted diffusion and T2/FLAIR signal associated with the left hippocampus that could be possible infarct.  2D echo showed EF of 55-60%.  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  LDL 133 and was started on Lipitor 40 mg.  A1c 5.9.  Patient was previously taking aspirin 81 mg and was recommended to start  aspirin 325 mg daily.  As his infarct is cryptogenic in etiology is recommended the patient undergo an outpatient TEE and a possible loop recorder placement.  Patient was discharged home in stable condition. On 06/25/2017, patient did undergo TEE which was negative for cardiac thrombus.  Loop recorder was placed at that time. Since discharge, patient has been doing well.  She was unaware that it was recommended at hospital discharge to increase aspirin from 81 mg to 325 mg.  She is tolerating 81 mg well without increase in bleeding or bruising.  She was unable to tolerate Lipitor as this caused headache and myalgias.  Her PCP started her on red yeast rice extract and will follow-up on lipid levels.  Blood pressure today satisfactory at 136/86.  She does check this at home and typically runs 110s/70s.  She does still have mild issues with memory loss but this has been gradually improving and she states it is approximately 95% back to "normal".  Denies symptoms of sleep apnea such as snoring, daytime sleepiness or frequent napping.  No new or worsening stroke/TIA symptoms.  ROS:   14 system review of systems performed and negative with exception of memory loss  PMH:  Past Medical History:  Diagnosis Date  . Abnormal Pap smear of cervix   . Allergy   . Anemia   . Breast mass in female 2010   questionable mass, no f/u (fibroadenoma vs papilloma)  . Headache(784.0)   . Hypertension   .  Pneumonia    2008  . Stroke ([place])    05/23/2017  . Transfusion history     PSH:  Past Surgical History:  Procedure Laterality Date  . ABDOMINAL HYSTERECTOMY  2015  . BILATERAL SALPINGECTOMY Bilateral 05/13/2013   Procedure: BILATERAL SALPINGECTOMY;  Surgeon: Azalia Bilis, MD;  Location: [facility];  Service: Gynecology;  Laterality: Bilateral;  . CESAREAN SECTION     x's 2  . COLPOSCOPY  2004  . CYSTO N/A 05/13/2013   Procedure: CYSTO;  Surgeon: Azalia Bilis, MD;  Location: [facility];  Service: Gynecology;   Laterality: N/A;  . LOOP RECORDER INSERTION N/A 06/25/2017   Procedure: LOOP RECORDER INSERTION;  Surgeon: Thompson Grayer, MD;  Location: [facility];  Service: Cardiovascular;  Laterality: N/A;  . SUPRACERVICAL ABDOMINAL HYSTERECTOMY N/A 05/13/2013   Procedure: HYSTERECTOMY SUPRACERVICAL ABDOMINAL;  Surgeon: Azalia Bilis, MD;  Location: [facility];  Service: Gynecology;  Laterality: N/A;  . TEE WITHOUT CARDIOVERSION N/A 06/25/2017   Procedure: TRANSESOPHAGEAL ECHOCARDIOGRAM (TEE);  Surgeon: Dorothy Spark, MD;  Location: [facility];  Service: Cardiovascular;  Laterality: N/A;  . WISDOM TOOTH EXTRACTION      Social History:  Social History   Socioeconomic History  . Marital status: Single    Spouse name: Not on file  . Number of children: 2  . Years of education: Not on file  . Highest education level: Not on file  Occupational History  . Not on file  Social Needs  . Financial resource strain: Not on file  . Food insecurity:    Worry: Not on file    Inability: Not on file  . Transportation needs:    Medical: Not on file    Non-medical: Not on file  Tobacco Use  . Smoking status: Never Smoker  . Smokeless tobacco: Never Used  Substance and Sexual Activity  . Alcohol use: No  . Drug use: No  . Sexual activity: Never    Partners: Male    Birth control/protection: Surgical    Comment: supracervical hysterectomy/BSO  Lifestyle  . Physical activity:    Days per week: Not on file    Minutes per session: Not on file  . Stress: Not on file  Relationships  . Social connections:    Talks on phone: Not on file    Gets together: Not on file    Attends religious service: Not on file    Active member of club or organization: Not on file    Attends meetings of clubs or organizations: Not on file    Relationship status: Not on file  . Intimate partner violence:    Fear of current or ex partner: Not on file    Emotionally abused: Not on file    Physically abused: Not on  file    Forced sexual activity: Not on file  Other Topics Concern  . Not on file  Social History Narrative  . Not on file    Family History:  Family History  Problem Relation Age of Onset  . Hypertension Father   . Heart disease Father        MI at age 52  . Heart attack Father 20  . Diabetes Maternal Grandmother   . Heart failure Maternal Grandmother   . Ovarian cancer Maternal Grandmother   . Hypertension Paternal Grandmother   . Hypertension Paternal Grandfather   . Breast cancer Maternal Aunt     Medications:   Current Outpatient Medications on File Prior  to Visit  Medication Sig Dispense Refill  . acetaminophen (TYLENOL) 500 MG tablet Take 1,000 mg by mouth every 6 (six) hours as needed for moderate pain.     Marland Kitchen aspirin EC 81 MG tablet Take 81 mg by mouth daily.    . fexofenadine (ALLEGRA) 180 MG tablet Take 180 mg by mouth daily.    . Garlic 716 MG CAPS Take 500 mg by mouth daily.     . hydrochlorothiazide (HYDRODIURIL) 12.5 MG tablet Take 1 tablet (12.5 mg total) by mouth daily. 30 tablet 3  . ibuprofen (ADVIL,MOTRIN) 200 MG tablet Take 400 mg by mouth every 6 (six) hours as needed for headache or moderate pain.     Holly Moyer 500 MG CAPS Take 500 mg by mouth daily.     . Multiple Vitamin (MULTIVITAMIN) capsule Take 1 capsule by mouth 3 (three) times a week.     . Red Yeast Rice 600 MG CAPS Take 600 mg by mouth daily.      No current facility-administered medications on file prior to visit.     Allergies:   Allergies  Allergen Reactions  . Lipitor [Atorvastatin Calcium] Other (See Comments)    Headaches, 'cloudy feeling'  . Macrobid [Nitrofurantoin Macrocrystal] Itching     Physical Exam  Vitals:   07/02/17 1058  BP: 136/86  Pulse: 81  Weight: 183 lb 6.4 oz (83.2 kg)  Height: 5' 3.5" (1.613 m)   Body mass index is 31.98 kg/m.  Visual Acuity Screening   Right eye Left eye Both eyes  Without correction:     With correction: 20/40 20/40      General: well developed, pleasant middle-aged African-American female, well nourished, seated, in no evident distress Head: head normocephalic and atraumatic.   Neck: supple with no carotid or supraclavicular bruits Cardiovascular: regular rate and rhythm, no murmurs Musculoskeletal: no deformity Skin:  no rash/petichiae Vascular:  Normal pulses all extremities  Neurologic Exam Mental Status: Awake and fully alert. Oriented to place and time. Recent and remote memory intact. Attention span, concentration and fund of knowledge appropriate. Mood and affect appropriate.  Cranial Nerves: Fundoscopic exam reveals sharp disc margins. Pupils equal, briskly reactive to light. Extraocular movements full without nystagmus. Visual fields full to confrontation. Hearing intact. Facial sensation intact. Face, tongue, palate moves normally and symmetrically.  Motor: Normal bulk and tone. Normal strength in all tested extremity muscles. Sensory.: intact to touch , pinprick , position and vibratory sensation.  Coordination: Rapid alternating movements normal in all extremities. Finger-to-nose and heel-to-shin performed accurately bilaterally. Gait and Station: Arises from chair without difficulty. Stance is normal. Gait demonstrates normal stride length and balance . Able to heel, toe and tandem walk without difficulty.  Reflexes: 1+ and symmetric. Toes downgoing.    NIHSS  0 Modified [facility]  1    Diagnostic Data (Labs, Imaging, Testing)  CTA head/neck Result date: 05/23/2017 Impression: Negative CTA head and neck  MRI brain Result date: 05/24/2017 IMPRESSION: Abnormal restricted diffusion and T2/FLAIR signal associated with the left hippocampus. No mass effect or hemorrhage. Whereas this could be an acute ischemic infarction, that is unlikely at this age. This pattern of finding can be seen with transient global amnesia, in postictal states and with limbic encephalitis.  2D echo Result  date: 05/24/2017 Study Conclusions - Left ventricle: The cavity size was normal. Systolic function was   normal. The estimated ejection fraction was in the range of 55%   to 60%. Wall motion was normal;  there were no regional wall   motion abnormalities. Left ventricular diastolic function   parameters were normal. - Aortic valve: Valve area (VTI): 2.04 cm^2. Valve area (Vmax):   2.37 cm^2. Valve area (Vmean): 2.31 cm^2.  EEG Result date: 05/24/2017 Impression:  This is an abnormal EEG.  There is evidence of a seizure tendency emanating from the right frontal lobe.    TEE Results today: 06/25/2017 No cardiac source of emboli was identified    ASSESSMENT: Evyn Kooyman is a 42 y.o. 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 Vascular risk factors include HTN.    PLAN: -Increase aspirin 81 mg to aspirin 325 mg daily -Continue red yeast rice extract for cholesterol management and continue to follow PCP regarding cholesterol blood pressure management -Referral for repeat MR head w/wo contrast for a follow-up after previous MRI in the hospital -Referral for EEG after previous abnormal EEG results -continue to monitor BP at home  -Maintain strict control of hypertension with blood pressure goal below 130/90, diabetes with hemoglobin A1c goal below 6.5% and cholesterol with LDL cholesterol (bad cholesterol) goal below 70 mg/dL. I also advised the patient to eat a healthy diet with plenty of whole grains, cereals, fruits and vegetables, exercise regularly and maintain ideal body weight.  Follow up in 2 months with Dr. Leonie Man or call earlier if needed   Greater than 50% time during this 25 minute consultation visit was spent on counseling and coordination of care about HTN, and HLD, discussion about risk benefit of anticoagulation and answering questions.     Venancio Poisson, AGNP-BC  [facility] [address] [place], [place] [address]  Phone [phone] Fax [phone]

## 2017-07-02 NOTE — Patient Instructions (Signed)
Continue aspirin 325 mg daily  and red yeast rice extrace  for secondary stroke prevention  Increase aspirin from 81mg  to 325mg  daily  Continue to monitor blood pressure at home  Follow up with PCP regarding cholesterol and blood pressure management  We will call you to schedule MRI and EEG  Continue to monitor loop recorder   Maintain strict control of hypertension with blood pressure goal below 130/90, diabetes with hemoglobin A1c goal below 6.5% and cholesterol with LDL cholesterol (bad cholesterol) goal below 70 mg/dL. I also advised the patient to eat a healthy diet with plenty of whole grains, cereals, fruits and vegetables, exercise regularly and maintain ideal body weight.  Followup in the future with Dr. Leonie Man in 2 months

## 2017-07-02 NOTE — Telephone Encounter (Signed)
[employer] order sent to GI they will obtain the auth and reach out to pt to schedule.

## 2017-07-02 NOTE — Addendum Note (Signed)
Addended by: Venancio Poisson on: 07/02/2017 01:12 PM   Modules accepted: Orders

## 2017-07-03 NOTE — Progress Notes (Signed)
I agree with the above plan 

## 2017-07-09 ENCOUNTER — Ambulatory Visit (INDEPENDENT_AMBULATORY_CARE_PROVIDER_SITE_OTHER): Payer: Self-pay | Admitting: *Deleted

## 2017-07-09 DIAGNOSIS — G459 Transient cerebral ischemic attack, unspecified: Secondary | ICD-10-CM

## 2017-07-09 LAB — CUP PACEART INCLINIC DEVICE CHECK
MDC IDC PG IMPLANT DT: 20190326
MDC IDC SESS DTM: 20190409175024

## 2017-07-09 NOTE — Progress Notes (Signed)
Wound check appointment. Steri-strips removed. Wound without redness or edema. Incision edges approximated, wound well healed. Normal device function. Battery status: good. R-waves 0.91mV. No symptom, tachy, or AF episodes. Pause and brady detection off since implant. Patient educated about wound care and Carelink monitor. Monthly summary reports and ROV with JA PRN.

## 2017-07-11 ENCOUNTER — Ambulatory Visit
Admission: RE | Admit: 2017-07-11 | Discharge: 2017-07-11 | Disposition: A | Discharge status: Home / Self Care | Payer: 59 | Source: Ambulatory Visit | Attending: Adult Health | Admitting: Adult Health

## 2017-07-11 DIAGNOSIS — I639 Cerebral infarction, unspecified: Secondary | ICD-10-CM | POA: Diagnosis not present

## 2017-07-11 MED ORDER — GADOBENATE DIMEGLUMINE 529 MG/ML IV SOLN
17.0000 mL | Freq: Once | INTRAVENOUS | Status: AC | PRN
  Administered 2017-07-11: 17 mL via INTRAVENOUS

## 2017-07-12 ENCOUNTER — Ambulatory Visit (INDEPENDENT_AMBULATORY_CARE_PROVIDER_SITE_OTHER): Payer: 59 | Admitting: Neurology

## 2017-07-12 DIAGNOSIS — R2990 Unspecified symptoms and signs involving the nervous system: Secondary | ICD-10-CM

## 2017-07-15 ENCOUNTER — Telehealth: Payer: Self-pay | Admitting: Cardiology

## 2017-07-15 ENCOUNTER — Telehealth: Payer: Self-pay | Admitting: Neurology

## 2017-07-15 NOTE — Telephone Encounter (Signed)
   Primary Cardiologist: Skeet Latch, MD  Chart reviewed as part of pre-operative protocol coverage. Given past medical history and time since last visit, based on ACC/AHA guidelines, Holly Moyer would be at acceptable risk from a cardiac standpoint for the planned procedure without further cardiovascular testing. She has had a recent TIA and has a loop recorder in place which so far has shown no atrial fibrillation. A tee did not reveal any cardiac source of emboli. She is on full dose aspirin per neurology.  Further clearance would be related to neurology.   I will route this recommendation to the requesting party via Epic fax function and remove from pre-op pool.  Please call with questions.  Daune Perch, NP 07/15/2017, 2:47 PM

## 2017-07-15 NOTE — Telephone Encounter (Signed)
Called the pt and reviewed the MRI results with her. Patient completed the EEG on Friday and I have informed her that once I have those results I will call her. Pt verbalized understanding. Pt had no questions at this time but was encouraged to call back if questions arise.

## 2017-07-15 NOTE — Telephone Encounter (Signed)
1. What dental office are you calling from? Dr.Robert Annamaria Boots, DDS [place]   2. What is your office phone and fax number?  [phone]   Fax   [phone]  3. What type of procedure is the patient having performed? Cleaning   4. What date is procedure scheduled or is the patient there now? 07/15/2017 yes pt is there now   What is your question (ex. Antibiotics prior to procedure, holding medication-we need to know how long dentist wants pt to hold med)? Is it okay to do a cleaning on pt

## 2017-07-15 NOTE — Telephone Encounter (Signed)
-----   Message from Darleen Crocker, RN sent at 07/15/2017 11:28 AM EDT -----   ----- Message ----- From: Venancio Poisson, NP Sent: 07/15/2017   7:59 AM To: Marval Regal, RN  Please notify patient that her MRI was negative for lesions or infarcts. We will call her once she has her EEG with results. Thank you

## 2017-07-22 ENCOUNTER — Telehealth: Payer: Self-pay | Admitting: Neurology

## 2017-07-22 NOTE — Telephone Encounter (Signed)
Attempted to call the patient to review EEG results. No answer. Mailbox was full and was unable to leave a message. Will try again

## 2017-07-22 NOTE — Telephone Encounter (Signed)
-----   Message from Venancio Poisson, NP sent at 07/22/2017 12:53 PM EDT ----- Please notify patient that her EEG looks normal without seizure activity. Thank you.

## 2017-07-25 ENCOUNTER — Ambulatory Visit (INDEPENDENT_AMBULATORY_CARE_PROVIDER_SITE_OTHER): Payer: 59 | Admitting: *Deleted

## 2017-07-25 DIAGNOSIS — G459 Transient cerebral ischemic attack, unspecified: Secondary | ICD-10-CM

## 2017-07-26 NOTE — Progress Notes (Signed)
Carelink Summary Report / Loop Recorder 

## 2017-07-30 NOTE — Telephone Encounter (Signed)
Attempted to call the pt a 2nd time. No answer. VM still full. Will send a letter.

## 2017-08-20 LAB — CUP PACEART REMOTE DEVICE CHECK
Date Time Interrogation Session: 20190425163628
Implantable Pulse Generator Implant Date: 20190326

## 2017-08-27 ENCOUNTER — Ambulatory Visit (INDEPENDENT_AMBULATORY_CARE_PROVIDER_SITE_OTHER): Payer: 59 | Admitting: *Deleted

## 2017-08-27 DIAGNOSIS — G459 Transient cerebral ischemic attack, unspecified: Secondary | ICD-10-CM

## 2017-08-28 NOTE — Progress Notes (Signed)
Carelink Summary Report / Loop Recorder 

## 2017-09-16 ENCOUNTER — Ambulatory Visit: Payer: 59 | Admitting: Neurology

## 2017-09-16 ENCOUNTER — Encounter: Payer: Self-pay | Admitting: Adult Health

## 2017-09-16 ENCOUNTER — Ambulatory Visit (INDEPENDENT_AMBULATORY_CARE_PROVIDER_SITE_OTHER): Payer: 59 | Admitting: Adult Health

## 2017-09-16 VITALS — BP 132/89 | HR 73 | Ht 63.5 in | Wt 185.6 lb

## 2017-09-16 DIAGNOSIS — I10 Essential (primary) hypertension: Secondary | ICD-10-CM | POA: Diagnosis not present

## 2017-09-16 DIAGNOSIS — G459 Transient cerebral ischemic attack, unspecified: Secondary | ICD-10-CM | POA: Diagnosis not present

## 2017-09-16 DIAGNOSIS — E785 Hyperlipidemia, unspecified: Secondary | ICD-10-CM | POA: Diagnosis not present

## 2017-09-16 NOTE — Progress Notes (Signed)
I agree with the above plan 

## 2017-09-16 NOTE — Patient Instructions (Addendum)
Continue aspirin 325 mg daily and red yeast rice for secondary stroke prevention  Continue to follow up with PCP regarding cholesterol and blood pressure management   Continue to stay active and eat a healthy   Maintain strict control of hypertension with blood pressure goal below 130/90, diabetes with hemoglobin A1c goal below 6.5% and cholesterol with LDL cholesterol (bad cholesterol) goal below 70 mg/dL. I also advised the patient to eat a healthy diet with plenty of whole grains, cereals, fruits and vegetables, exercise regularly and maintain ideal body weight.  Followup in the future with me as needed or call earlier if needed        Thank you for coming to see [place] at [facility]. I hope we have been able to provide you high quality care today.  You may receive a patient satisfaction survey over the next few weeks. We would appreciate your feedback and comments so that we may continue to improve ourselves and the health of our patients.

## 2017-09-16 NOTE — Progress Notes (Signed)
[facility] [address] [place]. [place] [address] [phone]       OFFICE FOLLOW UP NOTE  [name] Date of Birth:  24-Dec-1975 Medical Record Number:  [id number]   Reason for Referral:  hospital stroke follow up  CHIEF COMPLAINT:  Chief Complaint  Patient presents with  . 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 History obtained from patient and chart review. Reviewed all radiology images and labs personally.   Ms. [name] is a 42 year old female PMH of hypertension who presented with visual change and confusion on 05/23/2017.  She stated in the ED that when she was looking in her friend, she was unable to see her face she could see her body.  She states that the onset was abrupt with a progressive loss of vision.  Denied weakness or numbness.  She does describe that she was slightly disoriented and confused, having some difficulty with driving.  Over the course of 1-2 hours, symptoms gradually improved and are currently resolved the exception of mild headache at the center of the head.  Denied photophobia.  She does get headaches from time to time, but has no history of spots in her vision, tingling, or any other neurological symptoms of headaches in the past.  She denies strokes or strokelike symptoms in the past.  CTA head and neck was negative for large vessel occlusion.  MRI brain reviewed and did show abnormal restricted diffusion and T2/FLAIR signal associated with the left hippocampus that could be possible infarct.  2D echo showed EF of 55-60%.  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  LDL 133 and was started on Lipitor 40 mg.  A1c 5.9.  Patient was previously taking aspirin 81 mg and was recommended  to start aspirin 325 mg daily.  As his infarct is cryptogenic in etiology is recommended the patient undergo an outpatient TEE and a possible loop recorder placement.  Patient was discharged home in stable condition. On 06/25/2017, patient did undergo TEE which was negative for cardiac thrombus.  Loop recorder was placed at that time.  07/02/17 visit: Since discharge, patient has been doing well.  She was unaware that it was recommended at hospital discharge to increase aspirin from 81 mg to 325 mg.  She is tolerating 81 mg well without increase in bleeding or bruising.  She was unable to tolerate Lipitor as this caused headache and myalgias.  Her PCP started her on red yeast rice extract and will follow-up on lipid levels.  Blood pressure today satisfactory at 136/86.  She does check this at home and typically runs 110s/70s.  She does still have mild issues with memory loss but this has been gradually improving and she states it is approximately 95% back to "normal".  Denies symptoms of sleep apnea such as snoring, daytime sleepiness or frequent napping.  No new or worsening stroke/TIA symptoms.  09/16/17 UPDATE: Patient returns today for 50-month follow-up.  She states she has been doing well and has no residual deficits.  She continues to take aspirin with some bruising but no bleeding.  Her blood pressure satisfactory 132/89.  She continues to take red yeast rice for cholesterol control and will be having this checked in September  with her PCP visit.  She did undergo EEG repeat on 07/22/2017 and this was found normal without focal lateralizing or epileptic features.  Loop recorder continues to be negative for atrial fibrillation.  MRI brain repeated and there was a complete resolution of previously seen restricted diffusion and increased flair and T2 signal associated with hippocampus on the left seen on MRI from 05/24/2017.  She recently returned back from vacation in [place].  Denies new or worsening stroke/TIA  symptoms.    ROS:   14 system review of systems performed and negative with exception of no complaints  PMH:  Past Medical History:  Diagnosis Date  . Abnormal Pap smear of cervix   . Allergy   . Anemia   . Breast mass in female 2010   questionable mass, no f/u (fibroadenoma vs papilloma)  . Headache(784.0)   . Hypertension   . Pneumonia    2008  . Stroke ([place])    05/23/2017  . Transfusion history     PSH:  Past Surgical History:  Procedure Laterality Date  . ABDOMINAL HYSTERECTOMY  2015  . BILATERAL SALPINGECTOMY Bilateral 05/13/2013   Procedure: BILATERAL SALPINGECTOMY;  Surgeon: Azalia Bilis, MD;  Location: [facility];  Service: Gynecology;  Laterality: Bilateral;  . CESAREAN SECTION     x's 2  . COLPOSCOPY  2004  . CYSTO N/A 05/13/2013   Procedure: CYSTO;  Surgeon: Azalia Bilis, MD;  Location: [facility];  Service: Gynecology;  Laterality: N/A;  . LOOP RECORDER INSERTION N/A 06/25/2017   Procedure: LOOP RECORDER INSERTION;  Surgeon: Thompson Grayer, MD;  Location: [facility];  Service: Cardiovascular;  Laterality: N/A;  . SUPRACERVICAL ABDOMINAL HYSTERECTOMY N/A 05/13/2013   Procedure: HYSTERECTOMY SUPRACERVICAL ABDOMINAL;  Surgeon: Azalia Bilis, MD;  Location: [facility];  Service: Gynecology;  Laterality: N/A;  . TEE WITHOUT CARDIOVERSION N/A 06/25/2017   Procedure: TRANSESOPHAGEAL ECHOCARDIOGRAM (TEE);  Surgeon: Dorothy Spark, MD;  Location: [facility];  Service: Cardiovascular;  Laterality: N/A;  . WISDOM TOOTH EXTRACTION      Social History:  Social History   Socioeconomic History  . Marital status: Single    Spouse name: Not on file  . Number of children: 2  . Years of education: Not on file  . Highest education level: Not on file  Occupational History  . Not on file  Social Needs  . Financial resource strain: Not on file  . Food insecurity:    Worry: Not on file    Inability: Not on file  . Transportation needs:    Medical: Not on file     Non-medical: Not on file  Tobacco Use  . Smoking status: Never Smoker  . Smokeless tobacco: Never Used  Substance and Sexual Activity  . Alcohol use: No  . Drug use: No  . Sexual activity: Never    Partners: Male    Birth control/protection: Surgical    Comment: supracervical hysterectomy/BSO  Lifestyle  . Physical activity:    Days per week: Not on file    Minutes per session: Not on file  . Stress: Not on file  Relationships  . Social connections:    Talks on phone: Not on file    Gets together: Not on file    Attends religious service: Not on file    Active member of club or organization: Not on file    Attends meetings of clubs or organizations: Not on file    Relationship status: Not on file  .  Intimate partner violence:    Fear of current or ex partner: Not on file    Emotionally abused: Not on file    Physically abused: Not on file    Forced sexual activity: Not on file  Other Topics Concern  . Not on file  Social History Narrative  . Not on file    Family History:  Family History  Problem Relation Age of Onset  . Hypertension Father   . Heart disease Father        MI at age 68  . Heart attack Father 68  . Diabetes Maternal Grandmother   . Heart failure Maternal Grandmother   . Ovarian cancer Maternal Grandmother   . Hypertension Paternal Grandmother   . Hypertension Paternal Grandfather   . Breast cancer Maternal Aunt     Medications:   Current Outpatient Medications on File Prior to Visit  Medication Sig Dispense Refill  . acetaminophen (TYLENOL) 500 MG tablet Take 1,000 mg by mouth every 6 (six) hours as needed for moderate pain.     Marland Kitchen aspirin EC 325 MG tablet Take 1 tablet (325 mg total) by mouth daily. 30 tablet 0  . fexofenadine (ALLEGRA) 180 MG tablet Take 180 mg by mouth daily.    . Garlic 824 MG CAPS Take 500 mg by mouth daily.     . hydrochlorothiazide (HYDRODIURIL) 12.5 MG tablet Take 1 tablet (12.5 mg total) by mouth daily. 30 tablet 3  .  ibuprofen (ADVIL,MOTRIN) 200 MG tablet Take 400 mg by mouth every 6 (six) hours as needed for headache or moderate pain.     Javier Docker Oil 500 MG CAPS Take 500 mg by mouth daily.     . Multiple Vitamin (MULTIVITAMIN) capsule Take 1 capsule by mouth 3 (three) times a week.     . Red Yeast Rice 600 MG CAPS Take 600 mg by mouth daily.      No current facility-administered medications on file prior to visit.     Allergies:   Allergies  Allergen Reactions  . Lipitor [Atorvastatin Calcium] Other (See Comments)    Headaches, 'cloudy feeling'  . Macrobid [Nitrofurantoin Macrocrystal] Itching     Physical Exam  Vitals:   09/16/17 1315  BP: 132/89  Pulse: 73  Weight: 185 lb 9.6 oz (84.2 kg)  Height: 5' 3.5" (1.613 m)   Body mass index is 32.36 kg/m. No exam data present  General: well developed, pleasant middle-aged African-American female, well nourished, seated, in no evident distress Head: head normocephalic and atraumatic.   Neck: supple with no carotid or supraclavicular bruits Cardiovascular: regular rate and rhythm, no murmurs Musculoskeletal: no deformity Skin:  no rash/petichiae Vascular:  Normal pulses all extremities  Neurologic Exam Mental Status: Awake and fully alert. Oriented to place and time. Recent and remote memory intact. Attention span, concentration and fund of knowledge appropriate. Mood and affect appropriate.  Cranial Nerves: Fundoscopic exam reveals sharp disc margins. Pupils equal, briskly reactive to light. Extraocular movements full without nystagmus. Visual fields full to confrontation. Hearing intact. Facial sensation intact. Face, tongue, palate moves normally and symmetrically.  Motor: Normal bulk and tone. Normal strength in all tested extremity muscles. Sensory.: intact to touch , pinprick , position and vibratory sensation.  Coordination: Rapid alternating movements normal in all extremities. Finger-to-nose and heel-to-shin performed accurately  bilaterally. Gait and Station: Arises from chair without difficulty. Stance is normal. Gait demonstrates normal stride length and balance . Able to heel, toe and tandem walk without difficulty.  Reflexes: 1+ and symmetric. Toes downgoing.     Diagnostic Data (Labs, Imaging, Testing)  MR brain W WO contrast 07/11/2017 IMPRESSION: Unremarkable MRI brain w/wo contrast.There is complete resolution of  previously seen restricted diffusion and increased FLAIR and T2 signal associated with the hippocampus on the left seen on MRI from 05/24/2017  EEG ADULT 07/22/17 IMPRESSION: Normal electroencephalogram, awake, asleep and with activation procedures. There are no focal lateralizing or epileptiform features.    ASSESSMENT: Holly Moyer is a 42 y.o. 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 Vascular risk factors include HTN.  Patient returns today for follow-up visit and overall is doing well without neurological deficits.   PLAN: -Continue aspirin 325 mg daily and red yeast rice for secondary stroke prevention -f/u with PCP for cholesterol and blood pressure control -continue to monitor BP at home -Maintain strict control of hypertension with blood pressure goal below 130/90, diabetes with hemoglobin A1c goal below 6.5% and cholesterol with LDL cholesterol (bad cholesterol) goal below 70 mg/dL. I also advised the patient to eat a healthy diet with plenty of whole grains, cereals, fruits and vegetables, exercise regularly and maintain ideal body weight.  Follow up as needed or call with questions/concerns  Greater than 50% time during this 25 minute consultation visit was spent on counseling and coordination of care about HTN, and HLD, discussion about risk benefit of anticoagulation and answering questions.   Venancio Poisson, AGNP-BC  [facility] [address] [place], [place] [address]  Phone  [phone] Fax [phone]

## 2017-09-18 LAB — CUP PACEART REMOTE DEVICE CHECK
Implantable Pulse Generator Implant Date: 20190326
MDC IDC SESS DTM: 20190528163719

## 2017-09-30 ENCOUNTER — Ambulatory Visit (INDEPENDENT_AMBULATORY_CARE_PROVIDER_SITE_OTHER): Payer: 59 | Admitting: *Deleted

## 2017-09-30 DIAGNOSIS — I639 Cerebral infarction, unspecified: Secondary | ICD-10-CM | POA: Diagnosis not present

## 2017-09-30 NOTE — Progress Notes (Signed)
Carelink Summary Report / Loop Recorder 

## 2017-10-02 ENCOUNTER — Other Ambulatory Visit: Payer: Self-pay | Admitting: Family Medicine

## 2017-10-29 LAB — CUP PACEART REMOTE DEVICE CHECK
Implantable Pulse Generator Implant Date: 20190326
MDC IDC SESS DTM: 20190630184003

## 2017-11-01 ENCOUNTER — Ambulatory Visit (INDEPENDENT_AMBULATORY_CARE_PROVIDER_SITE_OTHER): Payer: 59 | Admitting: *Deleted

## 2017-11-01 DIAGNOSIS — I639 Cerebral infarction, unspecified: Secondary | ICD-10-CM

## 2017-11-04 NOTE — Progress Notes (Signed)
Carelink Summary Report / Loop Recorder 

## 2017-12-04 ENCOUNTER — Ambulatory Visit (INDEPENDENT_AMBULATORY_CARE_PROVIDER_SITE_OTHER): Payer: 59 | Admitting: *Deleted

## 2017-12-04 DIAGNOSIS — I639 Cerebral infarction, unspecified: Secondary | ICD-10-CM | POA: Diagnosis not present

## 2017-12-05 NOTE — Progress Notes (Signed)
Carelink Summary Report / Loop Recorder 

## 2017-12-12 LAB — CUP PACEART REMOTE DEVICE CHECK
Implantable Pulse Generator Implant Date: 20190326
MDC IDC SESS DTM: 20190802183749

## 2017-12-24 LAB — CUP PACEART REMOTE DEVICE CHECK
Date Time Interrogation Session: 20190904183629
MDC IDC PG IMPLANT DT: 20190326

## 2017-12-30 ENCOUNTER — Other Ambulatory Visit: Payer: Self-pay

## 2017-12-30 ENCOUNTER — Ambulatory Visit (INDEPENDENT_AMBULATORY_CARE_PROVIDER_SITE_OTHER): Payer: 59 | Admitting: Family Medicine

## 2017-12-30 ENCOUNTER — Encounter: Payer: Self-pay | Admitting: Family Medicine

## 2017-12-30 VITALS — BP 124/81 | HR 66 | Temp 98.5°F | Resp 16 | Ht 64.0 in | Wt 186.2 lb

## 2017-12-30 DIAGNOSIS — Z23 Encounter for immunization: Secondary | ICD-10-CM | POA: Diagnosis not present

## 2017-12-30 DIAGNOSIS — Z0000 Encounter for general adult medical examination without abnormal findings: Secondary | ICD-10-CM | POA: Diagnosis not present

## 2017-12-30 DIAGNOSIS — I10 Essential (primary) hypertension: Secondary | ICD-10-CM | POA: Diagnosis not present

## 2017-12-30 DIAGNOSIS — E559 Vitamin D deficiency, unspecified: Secondary | ICD-10-CM | POA: Diagnosis not present

## 2017-12-30 LAB — BASIC METABOLIC PANEL
BUN: 17 mg/dL (ref 6–23)
CALCIUM: 9.9 mg/dL (ref 8.4–10.5)
CO2: 30 mEq/L (ref 19–32)
Chloride: 102 mEq/L (ref 96–112)
Creatinine, Ser: 1.09 mg/dL (ref 0.40–1.20)
GFR: 71.07 mL/min (ref >60.00)
Glucose, Bld: 101 mg/dL — ABNORMAL HIGH (ref 70–99)
Potassium: 4.1 mEq/L (ref 3.5–5.1)
Sodium: 139 mEq/L (ref 135–145)

## 2017-12-30 LAB — HEPATIC FUNCTION PANEL
ALT: 10 U/L (ref 0–35)
AST: 15 U/L (ref 0–37)
Albumin: 4.3 g/dL (ref 3.5–5.2)
Alkaline Phosphatase: 50 U/L (ref 39–117)
BILIRUBIN TOTAL: 0.8 mg/dL (ref 0.2–1.2)
Bilirubin, Direct: 0.1 mg/dL (ref 0.0–0.3)
TOTAL PROTEIN: 7.5 g/dL (ref 6.0–8.3)

## 2017-12-30 LAB — LIPID PANEL
CHOLESTEROL: 218 mg/dL — AB (ref 0–200)
HDL: 59.3 mg/dL (ref >39.00)
LDL Cholesterol: 139 mg/dL — ABNORMAL HIGH (ref 0–99)
NonHDL: 158.63
Total CHOL/HDL Ratio: 4
Triglycerides: 97 mg/dL (ref 0.0–149.0)
VLDL: 19.4 mg/dL (ref 0.0–40.0)

## 2017-12-30 LAB — VITAMIN D 25 HYDROXY (VIT D DEFICIENCY, FRACTURES): VITD: 28.67 ng/mL — ABNORMAL LOW (ref 30.00–100.00)

## 2017-12-30 LAB — CBC WITH DIFFERENTIAL/PLATELET
BASOS ABS: 0 10*3/uL (ref 0.0–0.1)
BASOS PCT: 0.6 % (ref 0.0–3.0)
Eosinophils Absolute: 0 10*3/uL (ref 0.0–0.7)
Eosinophils Relative: 0.4 % (ref 0.0–5.0)
HEMATOCRIT: 41.3 % (ref 36.0–46.0)
HEMOGLOBIN: 13.8 g/dL (ref 12.0–15.0)
LYMPHS PCT: 40.7 % (ref 12.0–46.0)
Lymphs Abs: 2 10*3/uL (ref 0.7–4.0)
MCHC: 33.5 g/dL (ref 30.0–36.0)
MCV: 87.5 fl (ref 78.0–100.0)
MONOS PCT: 7.8 % (ref 3.0–12.0)
Monocytes Absolute: 0.4 10*3/uL (ref 0.1–1.0)
NEUTROS ABS: 2.5 10*3/uL (ref 1.4–7.7)
Neutrophils Relative %: 50.5 % (ref 43.0–77.0)
PLATELETS: 253 10*3/uL (ref 150.0–400.0)
RBC: 4.72 Mil/uL (ref 3.87–5.11)
RDW: 13.8 % (ref 11.5–15.5)
WBC: 4.9 10*3/uL (ref 4.0–10.5)

## 2017-12-30 LAB — TSH: TSH: 1.5 u[IU]/mL (ref 0.35–4.50)

## 2017-12-30 MED ORDER — SCOPOLAMINE 1 MG/3DAYS TD PT72: 1.0000 | MEDICATED_PATCH | TRANSDERMAL | 0 refills | Status: DC

## 2017-12-30 NOTE — Progress Notes (Signed)
   Subjective:    Patient ID: Holly Moyer, female    DOB: March 08, 1976, 42 y.o.   MRN: [id number]  HPI CPE- UTD on mammo, no need for pap due to hysterectomy.  UTD on Tdap.  Due for flu.  No concerns today.   Review of Systems Patient reports no vision/ hearing changes, adenopathy,fever, weight change,  persistant/recurrent hoarseness , swallowing issues, chest pain, palpitations, edema, persistant/recurrent cough, hemoptysis, dyspnea (rest/exertional/paroxysmal nocturnal), gastrointestinal bleeding (melena, rectal bleeding), abdominal pain, significant heartburn, bowel changes, GU symptoms (dysuria, hematuria, incontinence), Gyn symptoms (abnormal  bleeding, pain),  syncope, focal weakness, memory loss, numbness & tingling, skin/hair/nail changes, abnormal bruising or bleeding, anxiety, or depression.     Objective:   Physical Exam General Appearance:    Alert, cooperative, no distress, appears stated age  Head:    Normocephalic, without obvious abnormality, atraumatic  Eyes:    PERRL, conjunctiva/corneas clear, EOM's intact, fundi    benign, both eyes  Ears:    Normal TM's and external ear canals, both ears  Nose:   Nares normal, septum midline, mucosa normal, no drainage    or sinus tenderness  Throat:   Lips, mucosa, and tongue normal; teeth and gums normal  Neck:   Supple, symmetrical, trachea midline, no adenopathy;    Thyroid: no enlargement/tenderness/nodules  Back:     Symmetric, no curvature, ROM normal, no CVA tenderness  Lungs:     Clear to auscultation bilaterally, respirations unlabored  Chest Wall:    No tenderness or deformity   Heart:    Regular rate and rhythm, S1 and S2 normal, no murmur, rub   or gallop  Breast Exam:    Deferred to GYN  Abdomen:     Soft, non-tender, bowel sounds active all four quadrants,    no masses, no organomegaly  Genitalia:    Deferred to GYN  Rectal:    Extremities:   Extremities normal, atraumatic, no cyanosis or edema  Pulses:   2+ and  symmetric all extremities  Skin:   Skin color, texture, turgor normal, no rashes or lesions  Lymph nodes:   Cervical, supraclavicular, and axillary nodes normal  Neurologic:   CNII-XII intact, normal strength, sensation and reflexes    throughout          Assessment & Plan:

## 2017-12-30 NOTE — Assessment & Plan Note (Signed)
Pt's PE WNL w/ exception of obesity.  UTD on mammo, Tdap.  Flu given today.  Check labs.  Anticipatory guidance provided.

## 2017-12-30 NOTE — Assessment & Plan Note (Signed)
Chronic problem.  Well controlled today.  Stressed need for healthy diet and regular exercise.  Check labs.  No anticipated med changes.

## 2017-12-30 NOTE — Assessment & Plan Note (Signed)
Pt has hx of this.  Check labs and replete prn. 

## 2017-12-30 NOTE — Patient Instructions (Addendum)
Follow up in 6 months to recheck BP and cholesterol We'll notify you of your lab results and make any changes if needed Continue to work on healthy diet and regular exercise- you can do it! Call with any questions or concerns Have a great vacation!!!!

## 2017-12-31 ENCOUNTER — Other Ambulatory Visit: Payer: Self-pay | Admitting: General Practice

## 2017-12-31 ENCOUNTER — Other Ambulatory Visit: Payer: Self-pay | Admitting: Family Medicine

## 2017-12-31 MED ORDER — VITAMIN D (ERGOCALCIFEROL) 1.25 MG (50000 UNIT) PO CAPS: 50000.0000 [IU] | ORAL_CAPSULE | ORAL | 0 refills | Status: DC

## 2018-01-06 ENCOUNTER — Ambulatory Visit (INDEPENDENT_AMBULATORY_CARE_PROVIDER_SITE_OTHER): Payer: 59 | Admitting: *Deleted

## 2018-01-06 DIAGNOSIS — I639 Cerebral infarction, unspecified: Secondary | ICD-10-CM

## 2018-01-07 NOTE — Progress Notes (Signed)
Carelink Summary Report / Loop Recorder 

## 2018-01-20 LAB — CUP PACEART REMOTE DEVICE CHECK
Implantable Pulse Generator Implant Date: 20190326
MDC IDC SESS DTM: 20191007194002

## 2018-01-30 LAB — HM MAMMOGRAPHY

## 2018-02-07 ENCOUNTER — Encounter: Payer: Self-pay | Admitting: General Practice

## 2018-02-10 ENCOUNTER — Ambulatory Visit (INDEPENDENT_AMBULATORY_CARE_PROVIDER_SITE_OTHER): Payer: 59 | Admitting: *Deleted

## 2018-02-10 DIAGNOSIS — I639 Cerebral infarction, unspecified: Secondary | ICD-10-CM

## 2018-02-10 NOTE — Progress Notes (Signed)
Carelink Summary Report / Loop Recorder 

## 2018-03-10 ENCOUNTER — Other Ambulatory Visit: Payer: Self-pay | Admitting: Family Medicine

## 2018-03-13 ENCOUNTER — Ambulatory Visit (INDEPENDENT_AMBULATORY_CARE_PROVIDER_SITE_OTHER): Payer: 59

## 2018-03-13 ENCOUNTER — Ambulatory Visit: Payer: 59 | Admitting: Family Medicine

## 2018-03-13 DIAGNOSIS — I639 Cerebral infarction, unspecified: Secondary | ICD-10-CM

## 2018-03-14 NOTE — Progress Notes (Signed)
Carelink Summary Report / Loop Recorder 

## 2018-03-18 ENCOUNTER — Other Ambulatory Visit: Payer: Self-pay | Admitting: Family Medicine

## 2018-03-29 LAB — CUP PACEART REMOTE DEVICE CHECK
Date Time Interrogation Session: 20191109231121
Implantable Pulse Generator Implant Date: 20190326

## 2018-04-15 ENCOUNTER — Ambulatory Visit (INDEPENDENT_AMBULATORY_CARE_PROVIDER_SITE_OTHER): Payer: 59

## 2018-04-15 DIAGNOSIS — I639 Cerebral infarction, unspecified: Secondary | ICD-10-CM | POA: Diagnosis not present

## 2018-04-16 NOTE — Progress Notes (Signed)
Carelink Summary Report / Loop Recorder 

## 2018-04-18 LAB — CUP PACEART REMOTE DEVICE CHECK
Date Time Interrogation Session: 20200114234054
Implantable Pulse Generator Implant Date: 20190326

## 2018-04-19 ENCOUNTER — Other Ambulatory Visit: Payer: Self-pay | Admitting: Family Medicine

## 2018-04-24 LAB — CUP PACEART REMOTE DEVICE CHECK
Implantable Pulse Generator Implant Date: 20190326
MDC IDC SESS DTM: 20191212233659

## 2018-04-28 ENCOUNTER — Telehealth: Payer: Self-pay | Admitting: *Deleted

## 2018-04-28 NOTE — Telephone Encounter (Signed)
LMOVM (DPR) requesting manual [employer] transmission for review. Gave instructions and DC phone number for questions/concerns.  Received alert for 1 "AF" episode. No ECG available. Will review manual transmission when received.

## 2018-05-02 NOTE — Telephone Encounter (Signed)
Able to reach patient. She tried to send a transmission a few days ago but was unsuccessful. Patient agrees to try again tonight or this weekend. Carelink tech services number given in the event of an error code. Patient agrees to call next week if she is still unsuccessful. She denies additional questions or concerns at this time.

## 2018-05-02 NOTE — Telephone Encounter (Signed)
LMOVM (DPR) requesting manual [employer]link transmission for review. Gave instructions and DC phone number for questions/concerns.

## 2018-05-05 NOTE — Telephone Encounter (Signed)
LMOVM requesting that pt send a manual transmission w/ her home monitor.  

## 2018-05-06 NOTE — Telephone Encounter (Signed)
Transmission received and reviewed. "AF" episode ECG suggests SR w/ectopy, short periods of disorganized atrial arrhythmia. ECG printed and placed in Dr. Otilio Connors folder for review.

## 2018-05-19 ENCOUNTER — Other Ambulatory Visit: Payer: Self-pay | Admitting: Internal Medicine

## 2018-05-19 ENCOUNTER — Ambulatory Visit (INDEPENDENT_AMBULATORY_CARE_PROVIDER_SITE_OTHER): Payer: 59

## 2018-05-19 DIAGNOSIS — I639 Cerebral infarction, unspecified: Secondary | ICD-10-CM

## 2018-05-19 LAB — CUP PACEART REMOTE DEVICE CHECK
Date Time Interrogation Session: 20200217004031
Implantable Pulse Generator Implant Date: 20190326

## 2018-05-28 NOTE — Progress Notes (Signed)
Carelink Summary Report / Loop Recorder 

## 2018-06-20 ENCOUNTER — Ambulatory Visit (INDEPENDENT_AMBULATORY_CARE_PROVIDER_SITE_OTHER): Payer: 59 | Admitting: *Deleted

## 2018-06-20 ENCOUNTER — Other Ambulatory Visit: Payer: Self-pay

## 2018-06-20 DIAGNOSIS — I639 Cerebral infarction, unspecified: Secondary | ICD-10-CM

## 2018-06-21 LAB — CUP PACEART REMOTE DEVICE CHECK
Date Time Interrogation Session: 20200321014243
Implantable Pulse Generator Implant Date: 20190326

## 2018-06-25 ENCOUNTER — Other Ambulatory Visit: Payer: Self-pay | Admitting: Family Medicine

## 2018-06-27 NOTE — Progress Notes (Signed)
Carelink Summary Report / Loop Recorder 

## 2018-06-30 ENCOUNTER — Ambulatory Visit: Payer: 59 | Admitting: Family Medicine

## 2018-07-22 ENCOUNTER — Telehealth: Payer: Self-pay | Admitting: Cardiovascular Disease

## 2018-07-22 NOTE — Telephone Encounter (Signed)
New Message:    Patient calling stating she dose not need this monitor any more.

## 2018-07-22 NOTE — Telephone Encounter (Signed)
Spoke with patient and she has a loop recorder. She stated she has had about a year and has had no symptoms and she does not feel like she needs it anymore. Per patient there is about an $80 monthly fee. Will forward to Dr Rayann Heman

## 2018-07-22 NOTE — Telephone Encounter (Signed)
Please schedule telehealth visit with me to discuss

## 2018-07-25 NOTE — Telephone Encounter (Signed)
Pt was contacted by scheduling to discuss with Dr. Rayann Heman.  Pt has decided to wait at this time.  No further action needed at this time.

## 2018-11-12 ENCOUNTER — Encounter: Payer: 59 | Admitting: Internal Medicine

## 2019-01-01 ENCOUNTER — Other Ambulatory Visit: Payer: Self-pay | Admitting: Family Medicine

## 2019-06-19 ENCOUNTER — Encounter: Payer: Self-pay | Admitting: Certified Nurse Midwife

## 2019-10-14 ENCOUNTER — Other Ambulatory Visit: Payer: Self-pay

## 2019-10-14 ENCOUNTER — Ambulatory Visit (INDEPENDENT_AMBULATORY_CARE_PROVIDER_SITE_OTHER): Payer: No Typology Code available for payment source | Admitting: Family Medicine

## 2019-10-14 ENCOUNTER — Encounter: Payer: Self-pay | Admitting: Family Medicine

## 2019-10-14 VITALS — BP 122/83 | HR 76 | Temp 97.9°F | Resp 16 | Ht 64.0 in | Wt 187.2 lb

## 2019-10-14 DIAGNOSIS — Z0000 Encounter for general adult medical examination without abnormal findings: Secondary | ICD-10-CM

## 2019-10-14 DIAGNOSIS — E669 Obesity, unspecified: Secondary | ICD-10-CM | POA: Diagnosis not present

## 2019-10-14 DIAGNOSIS — E559 Vitamin D deficiency, unspecified: Secondary | ICD-10-CM

## 2019-10-14 DIAGNOSIS — Z23 Encounter for immunization: Secondary | ICD-10-CM

## 2019-10-14 NOTE — Assessment & Plan Note (Signed)
Pt has hx of similar.  Check labs and replete prn. °

## 2019-10-14 NOTE — Assessment & Plan Note (Signed)
Pt's PE WNL w/ exception of obesity.  Tdap given.  Has pap scheduled for next week.  Will ask GYN about mammo.  Check labs.  Anticipatory guidance provided.

## 2019-10-14 NOTE — Patient Instructions (Signed)
Follow up in 1 year or as needed We'll notify you of your lab results and make any changes if needed Continue to work on healthy diet and regular exercise- you're doing great!! Have GYN send me a copy of your pap and mammo Call with any questions or concerns HAPPY BIRTHDAY!!!

## 2019-10-14 NOTE — Progress Notes (Signed)
   Subjective:    Patient ID: Holly Moyer, female    DOB: 01-07-1976, 44 y.o.   MRN: [id number]  HPI CPE- pt has pap scheduled next week.  Due for Tdap and mammo.   Review of Systems Patient reports no vision/ hearing changes, adenopathy,fever, weight change, persistant/recurrent hoarseness , swallowing issues, chest pain, palpitations, edema, persistant/recurrent cough, hemoptysis, dyspnea (rest/exertional/paroxysmal nocturnal), gastrointestinal bleeding (melena, rectal bleeding), abdominal pain, significant heartburn, bowel changes, GU symptoms (dysuria, hematuria, incontinence), Gyn symptoms (abnormal  bleeding, pain),  syncope, focal weakness, memory loss, numbness & tingling, skin/hair/nail changes, abnormal bruising or bleeding, anxiety, or depression.   This visit occurred during the SARS-CoV-2 public health emergency.  Safety protocols were in place, including screening questions prior to the visit, additional usage of staff PPE, and extensive cleaning of exam room while observing appropriate contact time as indicated for disinfecting solutions.       Objective:   Physical Exam General Appearance:    Alert, cooperative, no distress, appears stated age, obese  Head:    Normocephalic, without obvious abnormality, atraumatic  Eyes:    PERRL, conjunctiva/corneas clear, EOM's intact, fundi    benign, both eyes  Ears:    Normal TM's and external ear canals, both ears  Nose:   Deferred due to COVID  Throat:   Neck:   Supple, symmetrical, trachea midline, no adenopathy;    Thyroid: no enlargement/tenderness/nodules  Back:     Symmetric, no curvature, ROM normal, no CVA tenderness  Lungs:     Clear to auscultation bilaterally, respirations unlabored  Chest Wall:    No tenderness or deformity   Heart:    Regular rate and rhythm, S1 and S2 normal, no murmur, rub   or gallop  Breast Exam:    Deferred to GYN  Abdomen:     Soft, non-tender, bowel sounds active all four quadrants,    no  masses, no organomegaly  Genitalia:    Deferred to GYN  Rectal:    Extremities:   Extremities normal, atraumatic, no cyanosis or edema  Pulses:   2+ and symmetric all extremities  Skin:   Skin color, texture, turgor normal, no rashes or lesions  Lymph nodes:   Cervical, supraclavicular, and axillary nodes normal  Neurologic:   CNII-XII intact, normal strength, sensation and reflexes    throughout          Assessment & Plan:

## 2019-10-14 NOTE — Assessment & Plan Note (Signed)
Ongoing issue for pt.  She has recently started working w/ a Engineer, maintenance and seeing a therapist and is down 12 lbs since May.  Applauded her efforts.  Check labs to risk stratify.  Will follow.

## 2019-10-15 ENCOUNTER — Encounter: Payer: Self-pay | Admitting: General Practice

## 2019-10-15 LAB — BASIC METABOLIC PANEL
BUN: 15 mg/dL (ref 6–23)
CO2: 24 mEq/L (ref 19–32)
Calcium: 9.7 mg/dL (ref 8.4–10.5)
Chloride: 105 mEq/L (ref 96–112)
Creatinine, Ser: 1.12 mg/dL (ref 0.40–1.20)
GFR: 64.24 mL/min (ref >60.00)
Glucose, Bld: 81 mg/dL (ref 70–99)
Potassium: 3.9 mEq/L (ref 3.5–5.1)
Sodium: 139 mEq/L (ref 135–145)

## 2019-10-15 LAB — TSH: TSH: 1.11 u[IU]/mL (ref 0.35–4.50)

## 2019-10-15 LAB — LIPID PANEL
Cholesterol: 188 mg/dL (ref 0–200)
HDL: 53.4 mg/dL (ref >39.00)
LDL Cholesterol: 120 mg/dL — ABNORMAL HIGH (ref 0–99)
NonHDL: 134.27
Total CHOL/HDL Ratio: 4
Triglycerides: 70 mg/dL (ref 0.0–149.0)
VLDL: 14 mg/dL (ref 0.0–40.0)

## 2019-10-15 LAB — HEPATIC FUNCTION PANEL
ALT: 13 U/L (ref 0–35)
AST: 14 U/L (ref 0–37)
Albumin: 4.6 g/dL (ref 3.5–5.2)
Alkaline Phosphatase: 44 U/L (ref 39–117)
Bilirubin, Direct: 0.2 mg/dL (ref 0.0–0.3)
Total Bilirubin: 1.3 mg/dL — ABNORMAL HIGH (ref 0.2–1.2)
Total Protein: 7.5 g/dL (ref 6.0–8.3)

## 2019-10-15 LAB — CBC WITH DIFFERENTIAL/PLATELET
Basophils Absolute: 0.1 10*3/uL (ref 0.0–0.1)
Basophils Relative: 0.9 % (ref 0.0–3.0)
Eosinophils Absolute: 0 10*3/uL (ref 0.0–0.7)
Eosinophils Relative: 0.4 % (ref 0.0–5.0)
HCT: 40.8 % (ref 36.0–46.0)
Hemoglobin: 13.6 g/dL (ref 12.0–15.0)
Lymphocytes Relative: 39.1 % (ref 12.0–46.0)
Lymphs Abs: 2.6 10*3/uL (ref 0.7–4.0)
MCHC: 33.3 g/dL (ref 30.0–36.0)
MCV: 88.5 fl (ref 78.0–100.0)
Monocytes Absolute: 0.5 10*3/uL (ref 0.1–1.0)
Monocytes Relative: 6.9 % (ref 3.0–12.0)
Neutro Abs: 3.5 10*3/uL (ref 1.4–7.7)
Neutrophils Relative %: 52.7 % (ref 43.0–77.0)
Platelets: 205 10*3/uL (ref 150.0–400.0)
RBC: 4.61 Mil/uL (ref 3.87–5.11)
RDW: 14 % (ref 11.5–15.5)
WBC: 6.6 10*3/uL (ref 4.0–10.5)

## 2019-10-15 LAB — VITAMIN D 25 HYDROXY (VIT D DEFICIENCY, FRACTURES): VITD: 30.93 ng/mL (ref 30.00–100.00)

## 2019-10-21 ENCOUNTER — Encounter: Payer: Self-pay | Admitting: Obstetrics & Gynecology

## 2019-10-21 ENCOUNTER — Ambulatory Visit (INDEPENDENT_AMBULATORY_CARE_PROVIDER_SITE_OTHER): Payer: No Typology Code available for payment source | Admitting: Obstetrics & Gynecology

## 2019-10-21 ENCOUNTER — Other Ambulatory Visit (HOSPITAL_COMMUNITY)
Admission: RE | Admit: 2019-10-21 | Discharge: 2019-10-21 | Disposition: A | Discharge status: Home / Self Care | Payer: No Typology Code available for payment source | Source: Ambulatory Visit | Attending: Obstetrics & Gynecology | Admitting: Obstetrics & Gynecology

## 2019-10-21 ENCOUNTER — Other Ambulatory Visit: Payer: Self-pay

## 2019-10-21 VITALS — BP 162/82 | HR 63 | Ht 64.0 in | Wt 188.0 lb

## 2019-10-21 DIAGNOSIS — Z01419 Encounter for gynecological examination (general) (routine) without abnormal findings: Secondary | ICD-10-CM | POA: Diagnosis not present

## 2019-10-21 DIAGNOSIS — Z1231 Encounter for screening mammogram for malignant neoplasm of breast: Secondary | ICD-10-CM

## 2019-10-21 DIAGNOSIS — I10 Essential (primary) hypertension: Secondary | ICD-10-CM | POA: Diagnosis not present

## 2019-10-21 DIAGNOSIS — N898 Other specified noninflammatory disorders of vagina: Secondary | ICD-10-CM | POA: Insufficient documentation

## 2019-10-21 MED ORDER — HYDROCHLOROTHIAZIDE 25 MG PO TABS: 25.0000 mg | ORAL_TABLET | Freq: Every day | ORAL | 3 refills | Status: DC

## 2019-10-21 NOTE — Progress Notes (Signed)
Subjective:     Holly Moyer is a 44 y.o. female here for a routine exam.  Current complaints: Pt reports vulvar itching and abnormal discharge. Nor present currently. Pt reports h/o Tia 2-3 years prev.  She is s/p c/s x 2 and supracervical hyst.       Gynecologic History Patient's last menstrual period was 05/09/2013. Contraception: status post hysterectomy (supra cervical) Last Pap: >3 years prev. Results were: normal. H/o prev abnormal PAP Last mammogram: 2 year prev  Obstetric History OB History  Gravida Para Term Preterm AB Living  3 2   0 1 2  SAB TAB Ectopic Multiple Live Births  1       1    # Outcome Date GA Lbr Len/2nd Weight Sex Delivery Anes PTL Lv  3 Para 06/1999    F CS-Classical     2 Para 01/1996    F CS-Classical   LIV  1 SAB            The following portions of the patient's history were reviewed and updated as appropriate: allergies, current medications, past family history, past medical history, past social history, past surgical history and problem list.  Review of Systems Pertinent items are noted in HPI.    Objective:  BP (!) 162/82   Pulse 63   Ht 5\' 4"  (1.626 m)   Wt 188 lb (85.3 kg)   LMP 05/09/2013 Comment: supracervical  BMI 32.27 kg/m  General Appearance:    Alert, cooperative, no distress, appears stated age  Head:    Normocephalic, without obvious abnormality, atraumatic  Eyes:    conjunctiva/corneas clear, EOM's intact, both eyes  Ears:    Normal external ear canals, both ears  Nose:   Nares normal, septum midline, mucosa normal, no drainage    or sinus tenderness  Throat:   Lips, mucosa, and tongue normal; teeth and gums normal  Neck:   Supple, symmetrical, trachea midline, no adenopathy;    thyroid:  no enlargement/tenderness/nodules  Back:     Symmetric, no curvature, ROM normal, no CVA tenderness  Lungs:     respirations unlabored  Chest Wall:    No tenderness or deformity   Heart:    Regular rate and rhythm  Breast Exam:    No  tenderness, masses, or nipple abnormality  Abdomen:     Soft, non-tender, bowel sounds active all four quadrants,    no masses, no organomegaly  Genitalia:    Normal female without lesion, discharge or tenderness   Cervix noted in place. No abnormal discharge noted.   Extremities:   Extremities normal, atraumatic, no cyanosis or edema  Pulses:   2+ and symmetric all extremities  Skin:   Skin color, texture, turgor normal, no rashes or lesions     Assessment:    Healthy female exam.   Breast cancer screen BP elevated today. H/o chronic HTN and TIA- will restart meds and send note to Dr. Birdie Riddle       Plan:     Diagnoses and all orders for this visit:  Well female exam with routine gynecological exam -     Cytology - PAP( [facility]) -     MM Digital Screening; Future -     Cervicovaginal ancillary only( [facility])  Vaginal discharge -     Cervicovaginal ancillary only( [facility])  Encounter for screening mammogram for malignant neoplasm of breast -     MM Digital Screening; Future  Chronic hypertension -  hydrochlorothiazide (HYDRODIURIL) 25 MG tablet; Take 1 tablet (25 mg total) by mouth daily.  f/u in 1 year or sooner prn   Stephane Niemann L. Harraway-Smith, M.D., Cherlynn June

## 2019-10-21 NOTE — Progress Notes (Signed)
Patient actually stopped taking her HCTZ because with her PCP it was normal last week. Kathrene Alu RN

## 2019-10-21 NOTE — Patient Instructions (Signed)

## 2019-10-23 LAB — CYTOLOGY - PAP
Comment: NEGATIVE
Diagnosis: NEGATIVE
High risk HPV: NEGATIVE

## 2019-10-23 LAB — CERVICOVAGINAL ANCILLARY ONLY
Bacterial Vaginitis (gardnerella): NEGATIVE
Candida Glabrata: NEGATIVE
Candida Vaginitis: NEGATIVE
Comment: NEGATIVE
Comment: NEGATIVE
Comment: NEGATIVE

## 2019-11-16 ENCOUNTER — Ambulatory Visit (INDEPENDENT_AMBULATORY_CARE_PROVIDER_SITE_OTHER): Payer: No Typology Code available for payment source | Admitting: Family Medicine

## 2019-11-16 ENCOUNTER — Other Ambulatory Visit: Payer: Self-pay

## 2019-11-16 ENCOUNTER — Encounter: Payer: Self-pay | Admitting: Family Medicine

## 2019-11-16 VITALS — BP 131/81 | HR 63 | Temp 98.0°F | Resp 16 | Ht 64.0 in | Wt 190.1 lb

## 2019-11-16 DIAGNOSIS — I10 Essential (primary) hypertension: Secondary | ICD-10-CM | POA: Diagnosis not present

## 2019-11-16 DIAGNOSIS — T753XXD Motion sickness, subsequent encounter: Secondary | ICD-10-CM | POA: Diagnosis not present

## 2019-11-16 MED ORDER — SCOPOLAMINE 1 MG/3DAYS TD PT72: 1.0000 | MEDICATED_PATCH | TRANSDERMAL | 0 refills | Status: DC

## 2019-11-16 NOTE — Progress Notes (Signed)
   Subjective:    Patient ID: Holly Moyer, female    DOB: 1976-01-27, 44 y.o.   MRN: [id number]  HPI HTN- pt was restarted on HCTZ 25mg  by GYN on 7/21.  Home BPs typically 120s/70s.  Pt was aware that 25mg  was not her previous dose and is taking 1/2 tab (12.5mg  daily).  Denies CP, SOB, HAs, visual changes, edema.  Pt is working on [employer] and exercise.  Motion sickness- needs refill on scopolamine   Review of Systems For ROS see HPI   This visit occurred during the SARS-CoV-2 public health emergency.  Safety protocols were in place, including screening questions prior to the visit, additional usage of staff PPE, and extensive cleaning of exam room while observing appropriate contact time as indicated for disinfecting solutions.       Objective:   Physical Exam Vitals reviewed.  Constitutional:      General: She is not in acute distress.    Appearance: Normal appearance. She is well-developed.  HENT:     Head: Normocephalic and atraumatic.  Eyes:     Conjunctiva/sclera: Conjunctivae normal.     Pupils: Pupils are equal, round, and reactive to light.  Neck:     Thyroid: No thyromegaly.  Cardiovascular:     Rate and Rhythm: Normal rate and regular rhythm.     Heart sounds: Normal heart sounds. No murmur heard.   Pulmonary:     Effort: Pulmonary effort is normal. No respiratory distress.     Breath sounds: Normal breath sounds.  Abdominal:     General: There is no distension.     Palpations: Abdomen is soft.     Tenderness: There is no abdominal tenderness.  Musculoskeletal:     Cervical back: Normal range of motion and neck supple.  Lymphadenopathy:     Cervical: No cervical adenopathy.  Skin:    General: Skin is warm and dry.  Neurological:     Mental Status: She is alert and oriented to person, place, and time.  Psychiatric:        Behavior: Behavior normal.           Assessment & Plan:

## 2019-11-16 NOTE — Patient Instructions (Signed)
Follow up as needed or as scheduled Continue the 12.5mg  HCTZ daily for BP Use the Scopolamine patch as needed for motion sickness Call with any questions or concerns Stay Safe!  Stay Healthy!

## 2019-11-16 NOTE — Assessment & Plan Note (Addendum)
Chronic problem.  Adequate control today since restarting HCTZ.  Currently asymptomatic.  Check BMP.  Will follow.

## 2019-11-17 LAB — BASIC METABOLIC PANEL
BUN: 13 mg/dL (ref 6–23)
CO2: 29 mEq/L (ref 19–32)
Calcium: 9.8 mg/dL (ref 8.4–10.5)
Chloride: 104 mEq/L (ref 96–112)
Creatinine, Ser: 1.07 mg/dL (ref 0.40–1.20)
GFR: 67.69 mL/min (ref >60.00)
Glucose, Bld: 92 mg/dL (ref 70–99)
Potassium: 4 mEq/L (ref 3.5–5.1)
Sodium: 140 mEq/L (ref 135–145)

## 2019-11-24 ENCOUNTER — Ambulatory Visit (HOSPITAL_BASED_OUTPATIENT_CLINIC_OR_DEPARTMENT_OTHER)
Admission: RE | Admit: 2019-11-24 | Discharge: 2019-11-24 | Disposition: A | Discharge status: Home / Self Care | Payer: No Typology Code available for payment source | Source: Ambulatory Visit | Attending: Obstetrics & Gynecology | Admitting: Obstetrics & Gynecology

## 2019-11-24 ENCOUNTER — Other Ambulatory Visit: Payer: Self-pay

## 2019-11-24 ENCOUNTER — Encounter (HOSPITAL_BASED_OUTPATIENT_CLINIC_OR_DEPARTMENT_OTHER): Payer: Self-pay

## 2019-11-24 DIAGNOSIS — Z01419 Encounter for gynecological examination (general) (routine) without abnormal findings: Secondary | ICD-10-CM

## 2019-11-24 DIAGNOSIS — Z1231 Encounter for screening mammogram for malignant neoplasm of breast: Secondary | ICD-10-CM

## 2019-12-16 ENCOUNTER — Ambulatory Visit: Payer: 59 | Admitting: Obstetrics and Gynecology

## 2020-09-28 ENCOUNTER — Encounter: Payer: Self-pay | Admitting: *Deleted

## 2020-10-14 ENCOUNTER — Encounter: Payer: No Typology Code available for payment source | Admitting: Family Medicine

## 2020-11-29 ENCOUNTER — Other Ambulatory Visit (HOSPITAL_BASED_OUTPATIENT_CLINIC_OR_DEPARTMENT_OTHER): Payer: Self-pay | Admitting: Family Medicine

## 2020-11-29 DIAGNOSIS — Z1231 Encounter for screening mammogram for malignant neoplasm of breast: Secondary | ICD-10-CM

## 2020-11-29 DIAGNOSIS — Z8673 Personal history of transient ischemic attack (TIA), and cerebral infarction without residual deficits: Secondary | ICD-10-CM | POA: Insufficient documentation

## 2021-01-02 ENCOUNTER — Encounter (HOSPITAL_BASED_OUTPATIENT_CLINIC_OR_DEPARTMENT_OTHER): Payer: Self-pay

## 2021-01-02 ENCOUNTER — Other Ambulatory Visit: Payer: Self-pay

## 2021-01-02 ENCOUNTER — Ambulatory Visit (HOSPITAL_BASED_OUTPATIENT_CLINIC_OR_DEPARTMENT_OTHER)
Admission: RE | Admit: 2021-01-02 | Discharge: 2021-01-02 | Disposition: A | Discharge status: Home / Self Care | Payer: No Typology Code available for payment source | Source: Ambulatory Visit | Attending: Family Medicine | Admitting: Family Medicine

## 2021-01-02 DIAGNOSIS — Z1231 Encounter for screening mammogram for malignant neoplasm of breast: Secondary | ICD-10-CM | POA: Insufficient documentation

## 2021-01-12 ENCOUNTER — Encounter: Payer: Self-pay | Admitting: Family Medicine

## 2021-01-12 ENCOUNTER — Ambulatory Visit (INDEPENDENT_AMBULATORY_CARE_PROVIDER_SITE_OTHER): Payer: No Typology Code available for payment source | Admitting: Family Medicine

## 2021-01-12 ENCOUNTER — Other Ambulatory Visit: Payer: Self-pay

## 2021-01-12 VITALS — BP 118/80 | HR 54 | Temp 97.6°F | Resp 17 | Ht 65.0 in | Wt 186.8 lb

## 2021-01-12 DIAGNOSIS — E559 Vitamin D deficiency, unspecified: Secondary | ICD-10-CM

## 2021-01-12 DIAGNOSIS — Z0000 Encounter for general adult medical examination without abnormal findings: Secondary | ICD-10-CM | POA: Diagnosis not present

## 2021-01-12 DIAGNOSIS — I10 Essential (primary) hypertension: Secondary | ICD-10-CM

## 2021-01-12 LAB — CBC WITH DIFFERENTIAL/PLATELET
Basophils Absolute: 0.1 10*3/uL (ref 0.0–0.1)
Basophils Relative: 1.3 % (ref 0.0–3.0)
Eosinophils Absolute: 0 10*3/uL (ref 0.0–0.7)
Eosinophils Relative: 0.4 % (ref 0.0–5.0)
HCT: 42.8 % (ref 36.0–46.0)
Hemoglobin: 14.1 g/dL (ref 12.0–15.0)
Lymphocytes Relative: 40.1 % (ref 12.0–46.0)
Lymphs Abs: 2.2 10*3/uL (ref 0.7–4.0)
MCHC: 32.9 g/dL (ref 30.0–36.0)
MCV: 90 fl (ref 78.0–100.0)
Monocytes Absolute: 0.4 10*3/uL (ref 0.1–1.0)
Monocytes Relative: 7.7 % (ref 3.0–12.0)
Neutro Abs: 2.7 10*3/uL (ref 1.4–7.7)
Neutrophils Relative %: 50.5 % (ref 43.0–77.0)
Platelets: 217 10*3/uL (ref 150.0–400.0)
RBC: 4.75 Mil/uL (ref 3.87–5.11)
RDW: 14.4 % (ref 11.5–15.5)
WBC: 5.4 10*3/uL (ref 4.0–10.5)

## 2021-01-12 LAB — TSH: TSH: 0.98 u[IU]/mL (ref 0.35–5.50)

## 2021-01-12 LAB — HEPATIC FUNCTION PANEL
ALT: 15 U/L (ref 0–35)
AST: 19 U/L (ref 0–37)
Albumin: 4.5 g/dL (ref 3.5–5.2)
Alkaline Phosphatase: 47 U/L (ref 39–117)
Bilirubin, Direct: 0.2 mg/dL (ref 0.0–0.3)
Total Bilirubin: 1.2 mg/dL (ref 0.2–1.2)
Total Protein: 7.7 g/dL (ref 6.0–8.3)

## 2021-01-12 LAB — BASIC METABOLIC PANEL
BUN: 16 mg/dL (ref 6–23)
CO2: 29 mEq/L (ref 19–32)
Calcium: 9.8 mg/dL (ref 8.4–10.5)
Chloride: 101 mEq/L (ref 96–112)
Creatinine, Ser: 1.13 mg/dL (ref 0.40–1.20)
GFR: 59.28 mL/min — ABNORMAL LOW (ref >60.00)
Glucose, Bld: 87 mg/dL (ref 70–99)
Potassium: 3.8 mEq/L (ref 3.5–5.1)
Sodium: 139 mEq/L (ref 135–145)

## 2021-01-12 LAB — LIPID PANEL
Cholesterol: 212 mg/dL — ABNORMAL HIGH (ref 0–200)
HDL: 61.1 mg/dL (ref >39.00)
LDL Cholesterol: 129 mg/dL — ABNORMAL HIGH (ref 0–99)
NonHDL: 150.83
Total CHOL/HDL Ratio: 3
Triglycerides: 107 mg/dL (ref 0.0–149.0)
VLDL: 21.4 mg/dL (ref 0.0–40.0)

## 2021-01-12 LAB — VITAMIN D 25 HYDROXY (VIT D DEFICIENCY, FRACTURES): VITD: 33.96 ng/mL (ref 30.00–100.00)

## 2021-01-12 MED ORDER — HYDROCHLOROTHIAZIDE 25 MG PO TABS: 25.0000 mg | ORAL_TABLET | Freq: Every day | ORAL | 3 refills | Status: DC

## 2021-01-12 NOTE — Progress Notes (Signed)
   Subjective:    Patient ID: Holly Moyer, female    DOB: 11-Jul-1975, 45 y.o.   MRN: [id number]  HPI CPE- UTD on pap, mammo, Tdap.  Declines flu.  Patient Care Team    Relationship Specialty Notifications Start End  Midge Minium, MD PCP - General Family Medicine  06/01/16   Skeet Latch, MD PCP - Cardiology Cardiology Admissions 07/15/17   Regina Eck, CNM Referring Physician Certified Nurse Midwife  06/27/16     Health Maintenance  Topic Date Due   Hepatitis C Screening  Never done   COVID-19 Vaccine (3 - Booster for Moderna series) 12/14/2019   INFLUENZA VACCINE  06/30/2021 (Originally 10/31/2020)   PAP SMEAR-Modifier  10/21/2022   TETANUS/TDAP  10/13/2029   HIV Screening  Completed   HPV VACCINES  Aged Out      Review of Systems Patient reports no vision/ hearing changes, adenopathy,fever, weight change,  persistant/recurrent hoarseness , swallowing issues, chest pain, palpitations, edema, persistant/recurrent cough, hemoptysis, dyspnea (rest/exertional/paroxysmal nocturnal), gastrointestinal bleeding (melena, rectal bleeding), abdominal pain, significant heartburn, bowel changes, GU symptoms (dysuria, hematuria, incontinence), Gyn symptoms (abnormal  bleeding, pain),  syncope, focal weakness, memory loss, numbness & tingling, skin/hair/nail changes, abnormal bruising or bleeding, anxiety, or depression.   This visit occurred during the SARS-CoV-2 public health emergency.  Safety protocols were in place, including screening questions prior to the visit, additional usage of staff PPE, and extensive cleaning of exam room while observing appropriate contact time as indicated for disinfecting solutions.      Objective:   Physical Exam General Appearance:    Alert, cooperative, no distress, appears stated age  Head:    Normocephalic, without obvious abnormality, atraumatic  Eyes:    PERRL, conjunctiva/corneas clear, EOM's intact, fundi    benign, both eyes  Ears:     Normal TM's and external ear canals, both ears  Nose:   Deferred to COVID  Throat:   Neck:   Supple, symmetrical, trachea midline, no adenopathy;    Thyroid: no enlargement/tenderness/nodules  Back:     Symmetric, no curvature, ROM normal, no CVA tenderness  Lungs:     Clear to auscultation bilaterally, respirations unlabored  Chest Wall:    No tenderness or deformity   Heart:    Regular rate and rhythm, S1 and S2 normal, no murmur, rub   or gallop  Breast Exam:    Deferred to GYN  Abdomen:     Soft, non-tender, bowel sounds active all four quadrants,    no masses, no organomegaly  Genitalia:    Deferred to GYN  Rectal:    Extremities:   Extremities normal, atraumatic, no cyanosis or edema  Pulses:   2+ and symmetric all extremities  Skin:   Skin color, texture, turgor normal, no rashes or lesions  Lymph nodes:   Cervical, supraclavicular, and axillary nodes normal  Neurologic:   CNII-XII intact, normal strength, sensation and reflexes    throughout          Assessment & Plan:

## 2021-01-12 NOTE — Assessment & Plan Note (Signed)
Pt's PE WNL.  UTD on pap, mammo, Tdap.  Too young for colonoscopy- that will start next year.  Check labs.  Anticipatory guidance provided.

## 2021-01-12 NOTE — Assessment & Plan Note (Signed)
Check labs and replete prn. 

## 2021-01-12 NOTE — Patient Instructions (Addendum)
Follow up in 1 year or as needed We'll notify you of your lab results and make any changes if needed Continue to work on healthy diet and regular exercise- you're doing great!!! Call with any questions or concerns Stay Safe! Stay Healthy! Happy Fall!!! 

## 2021-01-12 NOTE — Assessment & Plan Note (Signed)
Chronic problem.  Well controlled.  Check labs due to HCTZ.  Will follow.

## 2021-03-15 ENCOUNTER — Encounter: Payer: Self-pay | Admitting: Family Medicine

## 2021-03-17 ENCOUNTER — Ambulatory Visit (INDEPENDENT_AMBULATORY_CARE_PROVIDER_SITE_OTHER): Payer: No Typology Code available for payment source | Admitting: Family Medicine

## 2021-03-17 ENCOUNTER — Encounter: Payer: Self-pay | Admitting: Family Medicine

## 2021-03-17 VITALS — BP 122/82 | HR 71 | Temp 98.2°F | Resp 16 | Wt 189.6 lb

## 2021-03-17 DIAGNOSIS — R519 Headache, unspecified: Secondary | ICD-10-CM

## 2021-03-17 MED ORDER — PROMETHAZINE HCL 25 MG PO TABS: 25.0000 mg | ORAL_TABLET | Freq: Three times a day (TID) | ORAL | 0 refills | Status: DC | PRN

## 2021-03-17 MED ORDER — SUMATRIPTAN SUCCINATE 50 MG PO TABS: 50.0000 mg | ORAL_TABLET | ORAL | 3 refills | Status: DC | PRN

## 2021-03-17 MED ORDER — PREDNISONE 10 MG PO TABS: ORAL_TABLET | ORAL | 0 refills | Status: DC

## 2021-03-17 NOTE — Patient Instructions (Signed)
Follow up as needed or as scheduled- especially if symptoms aren't improving START the Prednisone as directed- take w/ food You can use Tylenol as needed while on the Prednisone but avoid any ibuprofen, motrin, excedrin, etc Once the Prednisone is done, at the first sign of a headache, take 2 Excedrin migraine If headache continues 20-30 minutes after you take the Excedrin, take the Sumatriptan If you have nausea- take the Promethazine Drink LOTS of fluids Call with any questions or concerns Hang in there! Happy Holidays!

## 2021-03-17 NOTE — Progress Notes (Signed)
° °  Subjective:    Patient ID: Holly Moyer, female    DOB: 04-02-1976, 45 y.o.   MRN: [id number]  HPI HA- sxs started last week.  Doesn't feel similar to BP headache.  Having pressure behind R eye- 'it feels like the top of my head is going to explode'.  Looking at phone or computer worsens sxs.  Mild nausea.  HA will worsen as day goes on.  + photophobia.  Pt has had some visual changes that she attributes to BP.  Pt can't say for certain whether visual changes precede HA.  Pt does still have ovaries but doesn't have cycle due to hysterectomy.  Pt has a lot of work stress.  Sxs have been ongoing x2 weeks.   Review of Systems For ROS see HPI   This visit occurred during the SARS-CoV-2 public health emergency.  Safety protocols were in place, including screening questions prior to the visit, additional usage of staff PPE, and extensive cleaning of exam room while observing appropriate contact time as indicated for disinfecting solutions.      Objective:   Physical Exam Vitals reviewed.  Constitutional:      General: She is not in acute distress.    Appearance: Normal appearance. She is well-developed. She is not ill-appearing.  HENT:     Head: Normocephalic and atraumatic.     Right Ear: Tympanic membrane and ear canal normal.     Left Ear: Tympanic membrane and ear canal normal.  Eyes:     Conjunctiva/sclera: Conjunctivae normal.     Pupils: Pupils are equal, round, and reactive to light.  Cardiovascular:     Rate and Rhythm: Normal rate and regular rhythm.     Heart sounds: Normal heart sounds.  Pulmonary:     Effort: Pulmonary effort is normal. No respiratory distress.     Breath sounds: Normal breath sounds. No wheezing or rales.  Musculoskeletal:        General: No tenderness.     Cervical back: Normal range of motion and neck supple.  Lymphadenopathy:     Cervical: No cervical adenopathy.  Skin:    General: Skin is warm and dry.  Neurological:     General: No focal  deficit present.     Mental Status: She is alert and oriented to person, place, and time.     Cranial Nerves: No cranial nerve deficit.     Coordination: Coordination normal.     Deep Tendon Reflexes: Reflexes are normal and symmetric.  Psychiatric:        Behavior: Behavior normal.        Thought Content: Thought content normal.        Judgment: Judgment normal.          Assessment & Plan:  Frontal HA- new.  Suspect this is actually a migraine and not a BP headache (since BP is normal).  Pt w/ photophobia, nausea, possible visual changes.  Given 2 weeks of sxs will start prednisone taper to break current headache cycle.  Then discussed treatment for next HA- if and when it occurs.  Start w/ Excedrin migraine.  If no relief, use Imitrex.  Phenergan prn.  Reviewed supportive care and red flags that should prompt return.  Pt expressed understanding and is in agreement w/ plan.

## 2021-05-31 ENCOUNTER — Emergency Department (HOSPITAL_BASED_OUTPATIENT_CLINIC_OR_DEPARTMENT_OTHER)
Admission: EM | Admit: 2021-05-31 | Discharge: 2021-06-01 | Disposition: A | Discharge status: Home / Self Care | Payer: No Typology Code available for payment source | Attending: Emergency Medicine | Admitting: Emergency Medicine

## 2021-05-31 ENCOUNTER — Other Ambulatory Visit: Payer: Self-pay

## 2021-05-31 ENCOUNTER — Encounter (HOSPITAL_BASED_OUTPATIENT_CLINIC_OR_DEPARTMENT_OTHER): Payer: Self-pay | Admitting: Urology

## 2021-05-31 ENCOUNTER — Emergency Department (HOSPITAL_BASED_OUTPATIENT_CLINIC_OR_DEPARTMENT_OTHER): Payer: No Typology Code available for payment source

## 2021-05-31 DIAGNOSIS — G43901 Migraine, unspecified, not intractable, with status migrainosus: Secondary | ICD-10-CM

## 2021-05-31 DIAGNOSIS — Z79899 Other long term (current) drug therapy: Secondary | ICD-10-CM | POA: Insufficient documentation

## 2021-05-31 DIAGNOSIS — Z7982 Long term (current) use of aspirin: Secondary | ICD-10-CM | POA: Diagnosis not present

## 2021-05-31 DIAGNOSIS — R519 Headache, unspecified: Secondary | ICD-10-CM | POA: Diagnosis present

## 2021-05-31 DIAGNOSIS — I10 Essential (primary) hypertension: Secondary | ICD-10-CM | POA: Insufficient documentation

## 2021-05-31 MED ORDER — KETOROLAC TROMETHAMINE 15 MG/ML IJ SOLN
15.0000 mg | Freq: Once | INTRAMUSCULAR | Status: AC
  Administered 2021-05-31: 15 mg via INTRAVENOUS
  Filled 2021-05-31: qty 1

## 2021-05-31 MED ORDER — SODIUM CHLORIDE 0.9 % IV BOLUS
1000.0000 mL | Freq: Once | INTRAVENOUS | Status: AC
  Administered 2021-05-31: 1000 mL via INTRAVENOUS

## 2021-05-31 MED ORDER — METOCLOPRAMIDE HCL 5 MG/ML IJ SOLN
10.0000 mg | Freq: Once | INTRAMUSCULAR | Status: AC
  Administered 2021-05-31: 10 mg via INTRAVENOUS
  Filled 2021-05-31: qty 2

## 2021-05-31 MED ORDER — DIPHENHYDRAMINE HCL 50 MG/ML IJ SOLN
25.0000 mg | Freq: Once | INTRAMUSCULAR | Status: AC
  Administered 2021-05-31: 25 mg via INTRAVENOUS
  Filled 2021-05-31: qty 1

## 2021-05-31 NOTE — ED Provider Notes (Signed)
? ?[facility] ?Provider Note: Georgena Spurling, MD, [facility] ? ?CSN: [id number] ?MRN: [id number] ?ARRIVAL: 05/31/21 at 2134 ?ROOM: [facility]08/[facility]08 ? ? ?CHIEF COMPLAINT  ?Headache ? ? ?HISTORY OF PRESENT ILLNESS  ?05/31/21 11:16 PM ?Holly Moyer is a 46 y.o. female with a history of headaches.  She is here with a headache since January of this year.  It has worsened over the past 2 weeks.  The headache is located primarily on the right side into the occiput and radiating down to the right side of the neck posteriorly.  It is not significantly worse with movement of the neck.  She rates the pain as a 7 out of 10.  It has not responded to sumatriptan or other medications.  She has some nausea but no vomiting.  She has had photophobia. ? ? ?Past Medical History:  ?Diagnosis Date  ? Abnormal Pap smear of cervix   ? Allergy   ? Anemia   ? Breast mass in female 2010  ? questionable mass, no f/u (fibroadenoma vs papilloma)  ? Headache(784.0)   ? Hypertension   ? Pneumonia   ? 2008  ? Stroke [facility])   ? 05/23/2017  ? Transfusion history   ? ? ?Past Surgical History:  ?Procedure Laterality Date  ? ABDOMINAL HYSTERECTOMY  2015  ? BILATERAL SALPINGECTOMY Bilateral 05/13/2013  ? Procedure: BILATERAL SALPINGECTOMY;  Surgeon: Azalia Bilis, MD;  Location: [facility];  Service: Gynecology;  Laterality: Bilateral;  ? CESAREAN SECTION    ? x's 2  ? COLPOSCOPY  2004  ? CYSTO N/A 05/13/2013  ? Procedure: CYSTO;  Surgeon: Azalia Bilis, MD;  Location: [facility];  Service: Gynecology;  Laterality: N/A;  ? LOOP RECORDER INSERTION N/A 06/25/2017  ? Procedure: LOOP RECORDER INSERTION;  Surgeon: Thompson Grayer, MD;  Location: [facility];  Service: Cardiovascular;  Laterality: N/A;  ? SUPRACERVICAL ABDOMINAL HYSTERECTOMY N/A 05/13/2013  ? Procedure: HYSTERECTOMY SUPRACERVICAL ABDOMINAL;  Surgeon: Azalia Bilis, MD;  Location: [facility];  Service: Gynecology;  Laterality: N/A;  ? TEE WITHOUT CARDIOVERSION N/A 06/25/2017  ? Procedure:  TRANSESOPHAGEAL ECHOCARDIOGRAM (TEE);  Surgeon: Dorothy Spark, MD;  Location: [facility];  Service: Cardiovascular;  Laterality: N/A;  ? WISDOM TOOTH EXTRACTION    ? ? ?Family History  ?Problem Relation Age of Onset  ? Hypertension Father   ? Heart disease Father   ?     MI at age 14  ? Heart attack Father 31  ? Diabetes Maternal Grandmother   ? Heart failure Maternal Grandmother   ? Ovarian cancer Maternal Grandmother   ? Hypertension Paternal Grandmother   ? Hypertension Paternal Grandfather   ? Breast cancer Maternal Aunt   ? ? ?Social History  ? ?Tobacco Use  ? Smoking status: Never  ? Smokeless tobacco: Never  ?Vaping Use  ? Vaping Use: Never used  ?Substance Use Topics  ? Alcohol use: No  ? Drug use: No  ? ? ?Prior to Admission medications   ?Medication Sig Start Date End Date Taking? Authorizing Provider  ?aspirin EC 325 MG tablet Take 1 tablet (325 mg total) by mouth daily. 07/02/17   Frann Rider, NP  ?co-enzyme Q-10 30 MG capsule Take by mouth.    [provider]  ?fexofenadine (ALLEGRA) 180 MG tablet Take 180 mg by mouth daily.    [provider]  ?hydrochlorothiazide (HYDRODIURIL) 25 MG tablet Take 1 tablet (25 mg total) by mouth daily. 01/12/21   Midge Minium, MD  ?  Multiple Vitamin (MULTIVITAMIN) capsule Take 1 capsule by mouth 3 (three) times a week.     [provider]  ?SUMAtriptan (IMITREX) 50 MG tablet Take 1 tablet (50 mg total) by mouth every 2 (two) hours as needed for migraine. May repeat in 2 hours if headache persists or recurs. 03/17/21   Midge Minium, MD  ? ? ?Allergies ?Lipitor [atorvastatin calcium] and Macrobid [nitrofurantoin macrocrystal] ? ? ?REVIEW OF SYSTEMS  ?Negative except as noted here or in the History of Present Illness. ? ? ?PHYSICAL EXAMINATION  ?Initial Vital Signs ?Blood pressure (!) 160/101, pulse 65, temperature 98.1 ?F (36.7 ?C), temperature source Oral, resp. rate 18, height 5\' 5"  (1.651 m), weight 86.2 kg, last menstrual  period 05/09/2013, SpO2 100 %. ? ?Examination ?General: Well-developed, well-nourished female in no acute distress; appearance consistent with age of record ?HENT: normocephalic; atraumatic; mild tenderness of right scalp ?Eyes: pupils equal, round and reactive to light; extraocular muscles intact; photophobia ?Neck: supple; mild tenderness of right posterior neck musculature ?Heart: regular rate and rhythm ?Lungs: clear to auscultation bilaterally ?Abdomen: soft; nondistended; nontender; bowel sounds present ?Extremities: No deformity; full range of motion ?Neurologic: Awake, alert and oriented; motor function intact in all extremities and symmetric; no facial droop ?Skin: Warm and dry ?Psychiatric: Normal mood and affect ? ? ?RESULTS  ?Summary of this visit's results, reviewed and interpreted by myself: ? ? EKG Interpretation ? ?Date/Time:    ?Ventricular Rate:    ?PR Interval:    ?QRS Duration:   ?QT Interval:    ?QTC Calculation:   ?R Axis:     ?Text Interpretation:   ?  ? ?  ? ?Laboratory Studies: ?No results found for this or any previous visit (from the past 24 hour(s)). ?Imaging Studies: ?CT Head Wo Contrast ? ?Result Date: 05/31/2021 ?CLINICAL DATA:  Headache, chronic, new features or increased frequency EXAM: CT HEAD WITHOUT CONTRAST TECHNIQUE: Contiguous axial images were obtained from the base of the skull through the vertex without intravenous contrast. RADIATION DOSE REDUCTION: This exam was performed according to the departmental dose-optimization program which includes automated exposure control, adjustment of the mA and/or kV according to patient size and/or use of iterative reconstruction technique. COMPARISON:  None. FINDINGS: Brain: Normal anatomic configuration. No abnormal intra or extra-axial mass lesion or fluid collection. No abnormal mass effect or midline shift. No evidence of acute intracranial hemorrhage or infarct. Ventricular size is normal. Cerebellum unremarkable. Vascular:  Unremarkable Skull: Intact Sinuses/Orbits: Paranasal sinuses are clear. Orbits are unremarkable. Other: Mastoid air cells and middle ear cavities are clear. IMPRESSION: No acute intracranial abnormality. Electronically Signed   By: Fidela Salisbury M.D.   On: 05/31/2021 22:57   ? ?ED COURSE and MDM  ?Nursing notes, initial and subsequent vitals signs, including pulse oximetry, reviewed and interpreted by myself. ? ?Vitals:  ? 05/31/21 2328 05/31/21 2330 05/31/21 2345 06/01/21 0000  ?BP: 125/82 (!) 131/96 (!) 134/93 (!) 142/89  ?Pulse: (!) 58 (!) 57 (!) 55 (!) 58  ?Resp: 16 18  18   ?Temp:      ?TempSrc:      ?SpO2: 98% 99% 100% 100%  ?Weight:      ?Height:      ? ?Medications  ?dexamethasone (DECADRON) injection 10 mg (has no administration in time range)  ?sodium chloride 0.9 % bolus 1,000 mL (0 mLs Intravenous Stopped 06/01/21 0048)  ?diphenhydrAMINE (BENADRYL) injection 25 mg (25 mg Intravenous Given 05/31/21 2343)  ?metoCLOPramide (REGLAN) injection 10 mg (10 mg Intravenous  Given 05/31/21 2344)  ?ketorolac (TORADOL) 15 MG/ML injection 15 mg (15 mg Intravenous Given 05/31/21 2344)  ? ?12:48 AM ?Patient's headache significantly improved after IV fluids and medications.  I suspect this represents status migrainosus.  Her CT scan does not show any acute intracranial abnormality such as tumor or bleeding.  We will add dexamethasone to help prevent rebound. ? ? ?PROCEDURES  ?Procedures ? ? ?ED DIAGNOSES  ? ?  ICD-10-CM   ?1. Status migrainosus  G43.901   ?  ? ? ? ?  ?Shanon Rosser, MD ?06/01/21 2763 ? ?

## 2021-05-31 NOTE — ED Triage Notes (Signed)
Pt reports bad headache since January  ?Worsening over past 2 weeks  ?Sumatriptan not helping ?States pain radiating into neck  ?Slight nausea no vomiting ? ?

## 2021-06-01 ENCOUNTER — Encounter: Payer: Self-pay | Admitting: Family Medicine

## 2021-06-01 DIAGNOSIS — R519 Headache, unspecified: Secondary | ICD-10-CM

## 2021-06-01 MED ORDER — DEXAMETHASONE SODIUM PHOSPHATE 10 MG/ML IJ SOLN
10.0000 mg | Freq: Once | INTRAMUSCULAR | Status: AC
Start: 2021-06-01 — End: 2021-06-01
  Administered 2021-06-01: 10 mg via INTRAVENOUS
  Filled 2021-06-01: qty 1

## 2021-06-01 NOTE — ED Notes (Signed)
Patient discharged to home.  All discharge instructions reviewed.  Patient verbalized understanding via teachback method.  VS WDL.  Respirations even and unlabored.  Ambulatory out of ED.   °

## 2021-06-29 NOTE — Progress Notes (Signed)
? ?Referring:  ?Midge Minium, MD ?[address] ?[place],  [place] [address] ? ?PCP: ?Midge Minium, MD ? ?Neurology was asked to evaluate Holly Moyer, a 46 year old female for a chief complaint of headaches.  Our recommendations of care will be communicated by shared medical record.   ? ?CC:  headaches ? ?History provided from self ? ?HPI:  ?Medical co-morbidities: TIA, HTN, HLD ? ?The patient presents for evaluation of headaches which began in 2012, but have been worsening over the past 6 months. She currently has at least 2 migraines per week. They can last up to 5 days at a time. ? ?She takes Excedrin migraine and Imitrex as needed for migraines.  ? ?In 2019 she had an episode of vision loss (vision went black) and confusion. Vision loss was different from her typical aura. States she was under a lot of stress at the time. She went to the ED where her BP was found to be very high, and she was diagnosed with a TIA. ? ?Headache History: ?Onset: 2012 ?Triggers: stress, lack of sleep ?Aura: floaters, blurred vision ?Location: vertex, left occiput and temple ?Quality/Description: aching, sharp ?Associated Symptoms: ? Photophobia: yes ? Phonophobia: no ? Nausea: yes ?+neck pain ?Worse with activity?: yes ?Duration of headaches: 5 days ? ?Headache days per month: 8 ?Headache free days per month: 22 ? ?Current Treatment: ?Abortive ?Excedrin ?Imitrex 50 mg PRN ? ?Preventative ?none ? ?Prior Therapies                                 ?Imitrex 50 mg PRN ?Excedrin ? ?LABS: ?CBC ?   ?Component Value Date/Time  ? WBC 5.4 01/12/2021 0931  ? RBC 4.75 01/12/2021 0931  ? HGB 14.1 01/12/2021 0931  ? HGB 13.2 06/20/2017 1302  ? HCT 42.8 01/12/2021 0931  ? HCT 40.3 06/20/2017 1302  ? PLT 217.0 01/12/2021 0931  ? PLT 255 06/20/2017 1302  ? MCV 90.0 01/12/2021 0931  ? MCV 90 06/20/2017 1302  ? MCH 29.4 06/20/2017 1302  ? MCH 29.4 06/04/2017 1711  ? MCHC 32.9 01/12/2021 0931  ? RDW 14.4 01/12/2021 0931  ? RDW 13.7  06/20/2017 1302  ? LYMPHSABS 2.2 01/12/2021 0931  ? MONOABS 0.4 01/12/2021 0931  ? EOSABS 0.0 01/12/2021 0931  ? BASOSABS 0.1 01/12/2021 0931  ? ? ?  Latest Ref Rng & Units 01/12/2021  ?  9:31 AM 11/16/2019  ?  2:51 PM 10/14/2019  ?  3:11 PM  ?CMP  ?Glucose 70 - 99 mg/dL 87   92   81    ?BUN 6 - 23 mg/dL '16   13   15    '$ ?Creatinine 0.40 - 1.20 mg/dL 1.13   1.07   1.12    ?Sodium 135 - 145 mEq/L 139   140   139    ?Potassium 3.5 - 5.1 mEq/L 3.8   4.0   3.9    ?Chloride 96 - 112 mEq/L 101   104   105    ?CO2 19 - 32 mEq/L '29   29   24    '$ ?Calcium 8.4 - 10.5 mg/dL 9.8   9.8   9.7    ?Total Protein 6.0 - 8.3 g/dL 7.7    7.5    ?Total Bilirubin 0.2 - 1.2 mg/dL 1.2    1.3    ?Alkaline Phos 39 - 117 U/L 47  44    ?AST 0 - 37 U/L 19    14    ?ALT 0 - 35 U/L 15    13    ? ? ? ?IMAGING:  ?MRI brain 07/2017: Unremarkable MRI brain w/wo contrast.There is complete resolution of  previously seen restricted diffusion and increased FLAIR and T2 signal associated with the hippocampus on the left seen on MRI from 05/24/2017 ? ?MRI brain 05/2017: Abnormal restricted diffusion and T2/FLAIR signal associated with the left hippocampus. No mass effect or hemorrhage. Whereas this could be an acute ischemic infarction, that is unlikely at this age. This pattern of finding can be seen with transient global amnesia, in postictal states and with limbic encephalitis ? ?CTA head/neck 05/2017: unremarkable ? ?Imaging independently reviewed on June 30, 2021  ? ?Current Outpatient Medications on File Prior to Visit  ?Medication Sig Dispense Refill  ? aspirin EC 325 MG tablet Take 1 tablet (325 mg total) by mouth daily. 30 tablet 0  ? co-enzyme Q-10 30 MG capsule Take by mouth.    ? fexofenadine (ALLEGRA) 180 MG tablet Take 180 mg by mouth daily.    ? hydrochlorothiazide (HYDRODIURIL) 25 MG tablet Take 1 tablet (25 mg total) by mouth daily. 90 tablet 3  ? Multiple Vitamin (MULTIVITAMIN) capsule Take 1 capsule by mouth 3 (three) times a week.     ?  SUMAtriptan (IMITREX) 50 MG tablet Take 1 tablet (50 mg total) by mouth every 2 (two) hours as needed for migraine. May repeat in 2 hours if headache persists or recurs. 10 tablet 3  ? ?No current facility-administered medications on file prior to visit.  ? ? ? ?Allergies: ?Allergies  ?Allergen Reactions  ? Lipitor [Atorvastatin Calcium] Other (See Comments)  ?  Headaches, 'cloudy feeling'  ? Macrobid [Nitrofurantoin Macrocrystal] Itching  ? ? ?Family History: ?Migraine or other headaches in the family:  no ?Aneurysms in a first degree relative:  no ?Brain tumors in the family:  no ?Other neurological illness in the family:   no ? ?Strong family of cardiac disease in the family ? ?Past Medical History: ?Past Medical History:  ?Diagnosis Date  ? Abnormal Pap smear of cervix   ? Allergy   ? Anemia   ? Breast mass in female 2010  ? questionable mass, no f/u (fibroadenoma vs papilloma)  ? Headache(784.0)   ? Hypertension   ? Pneumonia   ? 2008  ? Stroke [facility])   ? 05/23/2017  ? Transfusion history   ? ? ?Past Surgical History ?Past Surgical History:  ?Procedure Laterality Date  ? ABDOMINAL HYSTERECTOMY  2015  ? BILATERAL SALPINGECTOMY Bilateral 05/13/2013  ? Procedure: BILATERAL SALPINGECTOMY;  Surgeon: Azalia Bilis, MD;  Location: [facility];  Service: Gynecology;  Laterality: Bilateral;  ? CESAREAN SECTION    ? x's 2  ? COLPOSCOPY  2004  ? CYSTO N/A 05/13/2013  ? Procedure: CYSTO;  Surgeon: Azalia Bilis, MD;  Location: [facility];  Service: Gynecology;  Laterality: N/A;  ? LOOP RECORDER INSERTION N/A 06/25/2017  ? Procedure: LOOP RECORDER INSERTION;  Surgeon: Thompson Grayer, MD;  Location: [facility];  Service: Cardiovascular;  Laterality: N/A;  ? SUPRACERVICAL ABDOMINAL HYSTERECTOMY N/A 05/13/2013  ? Procedure: HYSTERECTOMY SUPRACERVICAL ABDOMINAL;  Surgeon: Azalia Bilis, MD;  Location: [facility];  Service: Gynecology;  Laterality: N/A;  ? TEE WITHOUT CARDIOVERSION N/A 06/25/2017  ? Procedure: TRANSESOPHAGEAL  ECHOCARDIOGRAM (TEE);  Surgeon: Dorothy Spark, MD;  Location: [facility];  Service: Cardiovascular;  Laterality: N/A;  ? WISDOM TOOTH EXTRACTION    ? ? ?Social History: ?Social History  ? ?Tobacco Use  ? Smoking status: Never  ? Smokeless tobacco: Never  ?Vaping Use  ? Vaping Use: Never used  ?Substance Use Topics  ? Alcohol use: No  ? Drug use: No  ? ? ?ROS: ?Negative for fevers, chills. Positive for headaches. All other systems reviewed and negative unless stated otherwise in HPI. ? ? ?Physical Exam:  ? ?Vital Signs: ?BP (!) 160/93   Pulse 79   Ht '5\' 4"'$  (1.626 m)   Wt 198 lb 9.6 oz (90.1 kg)   LMP 05/09/2013 Comment: supracervical  BMI 34.09 kg/m?  ?GENERAL: well appearing,in no acute distress,alert ?SKIN:  Color, texture, turgor normal. No rashes or lesions ?HEAD:  Normocephalic/atraumatic. ?CV:  RRR ?RESP: Normal respiratory effort ?MSK: no tenderness to palpation over occiput, neck, or shoulders ? ?NEUROLOGICAL: ?Mental Status: Alert, oriented to person, place and time,Follows commands ?Cranial Nerves: PERRL, visual fields intact to confrontation, extraocular movements intact, facial sensation intact, no facial droop or ptosis, hearing grossly intact, no dysarthria ?Motor: muscle strength 5/5 both upper and lower extremities ?Reflexes: 2+ throughout ?Sensation: intact to light touch all 4 extremities ?Coordination: Finger-to- nose-finger intact bilaterally ?Gait: normal-based ? ? ?IMPRESSION: ?46 year old female with a history of HTN, HLD, TIA who presents for evaluation of worsening migraines. Neurological exam today is normal. Will start Topamax for migraine prevention. Counseled patient to avoid triptans due to risk of vasoconstriction with history of TIA. Will start Ubrelvy for rescue. ? ?PLAN: ?-Prevention: Start Topamax 25 mg QHS. Increase dose by 25 mg each week as tolerated -- up to 100 mg/day ?-Rescue: Stop Imitrex. Start Ubrelvy 100 mg PRN ?-next steps: consider propranolol, SNRI for  prevention ? ?I spent a total of 33 minutes chart reviewing and counseling the patient. Headache education was done. Discussed treatment options including preventive and acute medications.  Discussed medication side effects, ad

## 2021-06-30 ENCOUNTER — Encounter: Payer: Self-pay | Admitting: Psychiatry

## 2021-06-30 ENCOUNTER — Ambulatory Visit (INDEPENDENT_AMBULATORY_CARE_PROVIDER_SITE_OTHER): Payer: No Typology Code available for payment source | Admitting: Psychiatry

## 2021-06-30 VITALS — BP 160/93 | HR 79 | Ht 64.0 in | Wt 198.6 lb

## 2021-06-30 DIAGNOSIS — G43109 Migraine with aura, not intractable, without status migrainosus: Secondary | ICD-10-CM | POA: Diagnosis not present

## 2021-06-30 MED ORDER — UBRELVY 100 MG PO TABS: 100.0000 mg | ORAL_TABLET | ORAL | 3 refills | Status: DC | PRN

## 2021-06-30 MED ORDER — TOPIRAMATE 25 MG PO TABS: ORAL_TABLET | ORAL | 3 refills | Status: DC

## 2021-06-30 NOTE — Patient Instructions (Addendum)
Start Topamax for headache prevention. Take 25 mg (1 pill) at bedtime for one week, then increase to 50 mg (2 pills) at bedtime for one week, then take 75 mg (3 pills) at bedtime for one week, then take 100 mg (4 pills) at bedtime ? ?Take Ubrelvy 100 mg as needed for migraines. May repeat a dose in 2 hours if migraine persists. ? ?

## 2021-07-03 ENCOUNTER — Telehealth: Payer: Self-pay

## 2021-07-03 NOTE — Telephone Encounter (Signed)
PA for Ubrelvy '100MG'$  Tab submitted via CMM. Your PA request has been sent to [facility]  ? ?Key: [id number] - PA Case ID: [id number] - Rx #: [id number] ?

## 2021-07-03 NOTE — Telephone Encounter (Addendum)
Fax Approval received from 07/03/21-07/03/2022 ?

## 2021-11-28 ENCOUNTER — Ambulatory Visit: Payer: No Typology Code available for payment source | Admitting: Psychiatry

## 2022-01-15 ENCOUNTER — Ambulatory Visit (INDEPENDENT_AMBULATORY_CARE_PROVIDER_SITE_OTHER): Payer: No Typology Code available for payment source | Admitting: Family Medicine

## 2022-01-15 ENCOUNTER — Encounter: Payer: Self-pay | Admitting: Family Medicine

## 2022-01-15 VITALS — BP 124/80 | HR 78 | Temp 97.8°F | Resp 17 | Ht 64.0 in | Wt 187.1 lb

## 2022-01-15 DIAGNOSIS — E669 Obesity, unspecified: Secondary | ICD-10-CM

## 2022-01-15 DIAGNOSIS — Z1211 Encounter for screening for malignant neoplasm of colon: Secondary | ICD-10-CM

## 2022-01-15 DIAGNOSIS — Z0000 Encounter for general adult medical examination without abnormal findings: Secondary | ICD-10-CM | POA: Diagnosis not present

## 2022-01-15 DIAGNOSIS — Z23 Encounter for immunization: Secondary | ICD-10-CM | POA: Diagnosis not present

## 2022-01-15 DIAGNOSIS — E559 Vitamin D deficiency, unspecified: Secondary | ICD-10-CM

## 2022-01-15 LAB — LIPID PANEL
Cholesterol: 201 mg/dL — ABNORMAL HIGH (ref 0–200)
HDL: 61.8 mg/dL (ref >39.00)
LDL Cholesterol: 121 mg/dL — ABNORMAL HIGH (ref 0–99)
NonHDL: 138.9
Total CHOL/HDL Ratio: 3
Triglycerides: 91 mg/dL (ref 0.0–149.0)
VLDL: 18.2 mg/dL (ref 0.0–40.0)

## 2022-01-15 LAB — BASIC METABOLIC PANEL
BUN: 13 mg/dL (ref 6–23)
CO2: 28 mEq/L (ref 19–32)
Calcium: 10.1 mg/dL (ref 8.4–10.5)
Chloride: 100 mEq/L (ref 96–112)
Creatinine, Ser: 0.98 mg/dL (ref 0.40–1.20)
GFR: 69.83 mL/min (ref >60.00)
Glucose, Bld: 93 mg/dL (ref 70–99)
Potassium: 3.6 mEq/L (ref 3.5–5.1)
Sodium: 137 mEq/L (ref 135–145)

## 2022-01-15 LAB — CBC WITH DIFFERENTIAL/PLATELET
Basophils Absolute: 0 10*3/uL (ref 0.0–0.1)
Basophils Relative: 0.2 % (ref 0.0–3.0)
Eosinophils Absolute: 0 10*3/uL (ref 0.0–0.7)
Eosinophils Relative: 0.1 % (ref 0.0–5.0)
HCT: 41.4 % (ref 36.0–46.0)
Hemoglobin: 13.9 g/dL (ref 12.0–15.0)
Lymphocytes Relative: 40.1 % (ref 12.0–46.0)
Lymphs Abs: 2.1 10*3/uL (ref 0.7–4.0)
MCHC: 33.6 g/dL (ref 30.0–36.0)
MCV: 91.3 fl (ref 78.0–100.0)
Monocytes Absolute: 0.4 10*3/uL (ref 0.1–1.0)
Monocytes Relative: 8 % (ref 3.0–12.0)
Neutro Abs: 2.7 10*3/uL (ref 1.4–7.7)
Neutrophils Relative %: 51.6 % (ref 43.0–77.0)
Platelets: 223 10*3/uL (ref 150.0–400.0)
RBC: 4.53 Mil/uL (ref 3.87–5.11)
RDW: 13.7 % (ref 11.5–15.5)
WBC: 5.3 10*3/uL (ref 4.0–10.5)

## 2022-01-15 LAB — HEPATIC FUNCTION PANEL
ALT: 14 U/L (ref 0–35)
AST: 18 U/L (ref 0–37)
Albumin: 4.5 g/dL (ref 3.5–5.2)
Alkaline Phosphatase: 44 U/L (ref 39–117)
Bilirubin, Direct: 0.2 mg/dL (ref 0.0–0.3)
Total Bilirubin: 1.4 mg/dL — ABNORMAL HIGH (ref 0.2–1.2)
Total Protein: 7.8 g/dL (ref 6.0–8.3)

## 2022-01-15 LAB — TSH: TSH: 0.89 u[IU]/mL (ref 0.35–5.50)

## 2022-01-15 LAB — VITAMIN D 25 HYDROXY (VIT D DEFICIENCY, FRACTURES): VITD: 36.93 ng/mL (ref 30.00–100.00)

## 2022-01-15 MED ORDER — SCOPOLAMINE 1 MG/3DAYS TD PT72: 1.0000 | MEDICATED_PATCH | TRANSDERMAL | 1 refills | Status: DC

## 2022-01-15 NOTE — Progress Notes (Signed)
   Subjective:    Patient ID: Holly Moyer, female    DOB: Nov 27, 1975, 46 y.o.   MRN: [id number]  HPI CPE- due to start colon cancer screening.  UTD on Pap, mammo, Tdap.  Will get flu today  Patient Care Team    Relationship Specialty Notifications Start End  Midge Minium, MD PCP - General Family Medicine  06/01/16   Skeet Latch, MD PCP - Cardiology Cardiology Admissions 07/15/17   Regina Eck, CNM Referring Physician Certified Nurse Midwife  06/27/16     Health Maintenance  Topic Date Due   COLONOSCOPY (Pts 45-55yr Insurance coverage will need to be confirmed)  Never done   INFLUENZA VACCINE  10/31/2021   PAP SMEAR-Modifier  10/21/2022   TETANUS/TDAP  10/13/2029   HIV Screening  Completed   HPV VACCINES  Aged Out   COVID-19 Vaccine  Discontinued   Hepatitis C Screening  Discontinued     Review of Systems Patient reports no vision/hearing changes, adenopathy,fever, persistant/recurrent hoarseness , swallowing issues, chest pain, palpitations, edema, persistant/recurrent cough, hemoptysis, dyspnea (rest/exertional/paroxysmal nocturnal), gastrointestinal bleeding (melena, rectal bleeding), abdominal pain, significant heartburn, bowel changes, GU symptoms (dysuria, hematuria, incontinence), Gyn symptoms (abnormal  bleeding, pain),  syncope, focal weakness, memory loss, numbness & tingling, skin/hair/nail changes, abnormal bruising or bleeding, anxiety, or depression.   + 12 lb weight loss    Objective:   Physical Exam General Appearance:    Alert, cooperative, no distress, appears stated age  Head:    Normocephalic, without obvious abnormality, atraumatic  Eyes:    PERRL, conjunctiva/corneas clear, EOM's intact both eyes  Ears:    Normal TM's and external ear canals, both ears  Nose:   Nares normal, septum midline, mucosa normal, no drainage    or sinus tenderness  Throat:   Lips, mucosa, and tongue normal; teeth and gums normal  Neck:   Supple, symmetrical,  trachea midline, no adenopathy;    Thyroid: no enlargement/tenderness/nodules  Back:     Symmetric, no curvature, ROM normal, no CVA tenderness  Lungs:     Clear to auscultation bilaterally, respirations unlabored  Chest Wall:    No tenderness or deformity   Heart:    Regular rate and rhythm, S1 and S2 normal, no murmur, rub   or gallop  Breast Exam:    Deferred to GYN  Abdomen:     Soft, non-tender, bowel sounds active all four quadrants,    no masses, no organomegaly  Genitalia:    Deferred to GYN  Rectal:    Extremities:   Extremities normal, atraumatic, no cyanosis or edema  Pulses:   2+ and symmetric all extremities  Skin:   Skin color, texture, turgor normal, no rashes or lesions  Lymph nodes:   Cervical, supraclavicular, and axillary nodes normal  Neurologic:   CNII-XII intact, normal strength, sensation and reflexes    throughout          Assessment & Plan:

## 2022-01-15 NOTE — Assessment & Plan Note (Signed)
Pt's PE WNL w/ exception of BMI.  UTD on Tdap.  Flu given today.  Will schedule mammogram for when she returns from [place] next month.  Pt elects for cologuard- ordered.  Check labs.  Anticipatory guidance provided.

## 2022-01-15 NOTE — Assessment & Plan Note (Signed)
Pt is down 12 lbs since last visit.  Applauded her efforts and encouraged her to continue.  Check labs to risk stratify.  Will follow.

## 2022-01-15 NOTE — Assessment & Plan Note (Signed)
Check labs and replete prn. 

## 2022-01-15 NOTE — Patient Instructions (Signed)
Follow up in 1 year or as needed We'll notify you of your lab results and make any changes if needed Keep up the good work on healthy diet and regular exercise- you look great!! Complete and return the Cologuard as directed Call and schedule your mammo when you get back Call with any questions or concerns ENJOY THE [place]!!!

## 2022-01-15 NOTE — Progress Notes (Signed)
Pt seen results Via my chart  

## 2022-01-30 ENCOUNTER — Other Ambulatory Visit: Payer: Self-pay | Admitting: Family Medicine

## 2022-01-30 DIAGNOSIS — I10 Essential (primary) hypertension: Secondary | ICD-10-CM

## 2022-02-19 ENCOUNTER — Other Ambulatory Visit (HOSPITAL_BASED_OUTPATIENT_CLINIC_OR_DEPARTMENT_OTHER): Payer: Self-pay | Admitting: Family Medicine

## 2022-02-19 DIAGNOSIS — Z1231 Encounter for screening mammogram for malignant neoplasm of breast: Secondary | ICD-10-CM

## 2022-02-20 ENCOUNTER — Ambulatory Visit (HOSPITAL_BASED_OUTPATIENT_CLINIC_OR_DEPARTMENT_OTHER)
Admission: RE | Admit: 2022-02-20 | Discharge: 2022-02-20 | Disposition: A | Discharge status: Home / Self Care | Payer: No Typology Code available for payment source | Source: Ambulatory Visit | Attending: Family Medicine | Admitting: Family Medicine

## 2022-02-20 ENCOUNTER — Encounter (HOSPITAL_BASED_OUTPATIENT_CLINIC_OR_DEPARTMENT_OTHER): Payer: Self-pay

## 2022-02-20 DIAGNOSIS — Z1231 Encounter for screening mammogram for malignant neoplasm of breast: Secondary | ICD-10-CM | POA: Insufficient documentation

## 2022-03-23 LAB — COLOGUARD: COLOGUARD: NEGATIVE

## 2022-08-26 ENCOUNTER — Telehealth: Payer: Self-pay

## 2022-08-26 ENCOUNTER — Other Ambulatory Visit (HOSPITAL_COMMUNITY): Payer: Self-pay

## 2022-08-26 NOTE — Telephone Encounter (Signed)
Patient Advocate Encounter   Received notification from [employer] that prior authorization is required for Ubrelvy 100MG  tablets   Submitted: 08-26-2022 Key [id number]  Status is pending

## 2022-08-27 ENCOUNTER — Other Ambulatory Visit (HOSPITAL_COMMUNITY): Payer: Self-pay

## 2022-08-27 NOTE — Telephone Encounter (Signed)
Pharmacy Patient Advocate Encounter  Prior Authorization for Ubrelvy 100MG  tablets has been approved by [employer] (ins).    PA # PA Case ID #: [id number] Effective dates: 08/26/2022 through 08/26/2023

## 2022-08-28 ENCOUNTER — Other Ambulatory Visit (HOSPITAL_COMMUNITY): Payer: Self-pay

## 2023-01-16 ENCOUNTER — Other Ambulatory Visit (HOSPITAL_BASED_OUTPATIENT_CLINIC_OR_DEPARTMENT_OTHER): Payer: Self-pay | Admitting: Family Medicine

## 2023-01-16 DIAGNOSIS — Z1231 Encounter for screening mammogram for malignant neoplasm of breast: Secondary | ICD-10-CM

## 2023-01-17 ENCOUNTER — Ambulatory Visit: Payer: No Typology Code available for payment source | Admitting: Family Medicine

## 2023-01-17 ENCOUNTER — Encounter: Payer: Self-pay | Admitting: Family Medicine

## 2023-01-17 VITALS — BP 132/80 | HR 61 | Temp 97.5°F | Ht 64.0 in | Wt 195.0 lb

## 2023-01-17 DIAGNOSIS — Z1159 Encounter for screening for other viral diseases: Secondary | ICD-10-CM | POA: Diagnosis not present

## 2023-01-17 DIAGNOSIS — E559 Vitamin D deficiency, unspecified: Secondary | ICD-10-CM

## 2023-01-17 DIAGNOSIS — I10 Essential (primary) hypertension: Secondary | ICD-10-CM | POA: Diagnosis not present

## 2023-01-17 DIAGNOSIS — Z0000 Encounter for general adult medical examination without abnormal findings: Secondary | ICD-10-CM | POA: Diagnosis not present

## 2023-01-17 LAB — VITAMIN D 25 HYDROXY (VIT D DEFICIENCY, FRACTURES): VITD: 22.93 ng/mL — ABNORMAL LOW (ref 30.00–100.00)

## 2023-01-17 LAB — BASIC METABOLIC PANEL
BUN: 15 mg/dL (ref 6–23)
CO2: 25 meq/L (ref 19–32)
Calcium: 9.8 mg/dL (ref 8.4–10.5)
Chloride: 103 meq/L (ref 96–112)
Creatinine, Ser: 1.1 mg/dL (ref 0.40–1.20)
GFR: 60.37 mL/min (ref >60.00)
Glucose, Bld: 82 mg/dL (ref 70–99)
Potassium: 3.5 meq/L (ref 3.5–5.1)
Sodium: 139 meq/L (ref 135–145)

## 2023-01-17 LAB — CBC WITH DIFFERENTIAL/PLATELET
Basophils Absolute: 0 10*3/uL (ref 0.0–0.1)
Basophils Relative: 0.4 % (ref 0.0–3.0)
Eosinophils Absolute: 0 10*3/uL (ref 0.0–0.7)
Eosinophils Relative: 0.3 % (ref 0.0–5.0)
HCT: 43.4 % (ref 36.0–46.0)
Hemoglobin: 14.3 g/dL (ref 12.0–15.0)
Lymphocytes Relative: 36.6 % (ref 12.0–46.0)
Lymphs Abs: 2.1 10*3/uL (ref 0.7–4.0)
MCHC: 32.8 g/dL (ref 30.0–36.0)
MCV: 90.7 fL (ref 78.0–100.0)
Monocytes Absolute: 0.5 10*3/uL (ref 0.1–1.0)
Monocytes Relative: 8.7 % (ref 3.0–12.0)
Neutro Abs: 3.1 10*3/uL (ref 1.4–7.7)
Neutrophils Relative %: 54 % (ref 43.0–77.0)
Platelets: 212 10*3/uL (ref 150.0–400.0)
RBC: 4.79 Mil/uL (ref 3.87–5.11)
RDW: 13.9 % (ref 11.5–15.5)
WBC: 5.7 10*3/uL (ref 4.0–10.5)

## 2023-01-17 LAB — HEPATIC FUNCTION PANEL
ALT: 16 U/L (ref 0–35)
AST: 20 U/L (ref 0–37)
Albumin: 4.4 g/dL (ref 3.5–5.2)
Alkaline Phosphatase: 54 U/L (ref 39–117)
Bilirubin, Direct: 0.1 mg/dL (ref 0.0–0.3)
Total Bilirubin: 1.2 mg/dL (ref 0.2–1.2)
Total Protein: 8.1 g/dL (ref 6.0–8.3)

## 2023-01-17 LAB — LIPID PANEL
Cholesterol: 228 mg/dL — ABNORMAL HIGH (ref 0–200)
HDL: 57.5 mg/dL (ref >39.00)
LDL Cholesterol: 145 mg/dL — ABNORMAL HIGH (ref 0–99)
NonHDL: 170.82
Total CHOL/HDL Ratio: 4
Triglycerides: 129 mg/dL (ref 0.0–149.0)
VLDL: 25.8 mg/dL (ref 0.0–40.0)

## 2023-01-17 LAB — TSH: TSH: 1.29 u[IU]/mL (ref 0.35–5.50)

## 2023-01-17 NOTE — Patient Instructions (Addendum)
Follow up in 6 months to recheck blood pressure, cholesterol We'll notify you of your lab results and make any changes if needed Continue to work on healthy low carb diet and regular physical activity- you can do it! Call with any questions or concerns Stay Safe! Stay Healthy! Happy Fall!!!

## 2023-01-17 NOTE — Assessment & Plan Note (Signed)
Check labs and replete prn. 

## 2023-01-17 NOTE — Assessment & Plan Note (Signed)
Pt's PE WNL w/ exception of BMI.  UTD on Tdap, cologuard, mammo.  Check labs.  Anticipatory guidance provided.

## 2023-01-17 NOTE — Progress Notes (Signed)
   Subjective:    Patient ID: Holly Moyer, female    DOB: [birth year], 47 y.o.   MRN: [id number]  HPI CPE- UTD on Tdap, cologuard.  UTD on mammo.  No need for pap due to hysterectomy.  Declines flu  Patient Care Team    Relationship Specialty Notifications Start End  Sheliah Hatch, MD PCP - General Family Medicine  06/01/16   Chilton Si, MD PCP - Cardiology Cardiology Admissions 07/15/17   Verner Chol, CNM Referring Physician Certified Nurse Midwife  06/27/16   [facility]    01/17/23   [facility]    01/17/23   [facility]    01/17/23     Health Maintenance  Topic Date Due   Hepatitis C Screening  Never done   INFLUENZA VACCINE  11/01/2022   COVID-19 Vaccine (3 - 2023-24 season) 12/02/2022   Fecal DNA (Cologuard)  03/15/2025   DTaP/Tdap/Td (3 - Td or Tdap) 10/13/2029   HIV Screening  Completed   HPV VACCINES  Aged Out      Review of Systems Patient reports no vision/ hearing changes, adenopathy,fever, weight change,  persistant/recurrent hoarseness , swallowing issues, chest pain, palpitations, edema, persistant/recurrent cough, hemoptysis, dyspnea (rest/exertional/paroxysmal nocturnal), gastrointestinal bleeding (melena, rectal bleeding), abdominal pain, significant heartburn, bowel changes, GU symptoms (dysuria, hematuria, incontinence), Gyn symptoms (abnormal  bleeding, pain),  syncope, focal weakness, memory loss, numbness & tingling, skin/hair/nail changes, abnormal bruising or bleeding, anxiety, or depression.     Objective:   Physical Exam General Appearance:    Alert, cooperative, no distress, appears stated age  Head:    Normocephalic, without obvious abnormality, atraumatic  Eyes:    PERRL, conjunctiva/corneas clear, EOM's intact both eyes  Ears:    Normal TM's and external ear canals, both ears  Nose:   Nares normal, septum midline, mucosa normal, no drainage    or sinus tenderness  Throat:   Lips,  mucosa, and tongue normal; teeth and gums normal  Neck:   Supple, symmetrical, trachea midline, no adenopathy;    Thyroid: no enlargement/tenderness/nodules  Back:     Symmetric, no curvature, ROM normal, no CVA tenderness  Lungs:     Clear to auscultation bilaterally, respirations unlabored  Chest Wall:    No tenderness or deformity   Heart:    Regular rate and rhythm, S1 and S2 normal, no murmur, rub   or gallop  Breast Exam:    Deferred to GYN  Abdomen:     Soft, non-tender, bowel sounds active all four quadrants,    no masses, no organomegaly  Genitalia:    Deferred to GYN  Rectal:    Extremities:   Extremities normal, atraumatic, no cyanosis or edema  Pulses:   2+ and symmetric all extremities  Skin:   Skin color, texture, turgor normal, no rashes or lesions  Lymph nodes:   Cervical, supraclavicular, and axillary nodes normal  Neurologic:   CNII-XII intact, normal strength, sensation and reflexes    throughout          Assessment & Plan:

## 2023-01-18 ENCOUNTER — Telehealth: Payer: Self-pay

## 2023-01-18 ENCOUNTER — Other Ambulatory Visit: Payer: Self-pay

## 2023-01-18 DIAGNOSIS — E785 Hyperlipidemia, unspecified: Secondary | ICD-10-CM

## 2023-01-18 DIAGNOSIS — E559 Vitamin D deficiency, unspecified: Secondary | ICD-10-CM

## 2023-01-18 LAB — HEPATITIS C ANTIBODY: Hepatitis C Ab: NONREACTIVE

## 2023-01-18 MED ORDER — VITAMIN D (ERGOCALCIFEROL) 1.25 MG (50000 UNIT) PO CAPS: 50000.0000 [IU] | ORAL_CAPSULE | ORAL | 0 refills | Status: DC

## 2023-01-18 MED ORDER — ROSUVASTATIN CALCIUM 10 MG PO TABS: 10.0000 mg | ORAL_TABLET | Freq: Every day | ORAL | 3 refills | Status: DC

## 2023-01-18 NOTE — Telephone Encounter (Signed)
-----   Message from Neena Rhymes sent at 01/18/2023  7:35 AM EDT ----- Vit D is low.  Based on this, we need to start 50,000 units weekly x12 weeks in addition to daily OTC supplement of at least 2000 units.   Total cholesterol and LDL (bad cholesterol) have both increased since last check.  Based on this, I recommend starting Crestor 10mg  nightly (#30, 3 refills) while working on healthy diet and regular exercise.  We will also repeat your liver functions (LFTs, dx- hyperlipidemia) at a lab only visit in 6 weeks to ensure you are metabolizing the medication appropriately.  Remainder of labs look good!

## 2023-01-18 NOTE — Telephone Encounter (Signed)
Pt has called back and was informed of lab results. Will start both medications this weekend

## 2023-02-25 ENCOUNTER — Ambulatory Visit (HOSPITAL_BASED_OUTPATIENT_CLINIC_OR_DEPARTMENT_OTHER)
Admission: RE | Admit: 2023-02-25 | Discharge: 2023-02-25 | Disposition: A | Discharge status: Home / Self Care | Payer: No Typology Code available for payment source | Source: Ambulatory Visit | Attending: Family Medicine | Admitting: Family Medicine

## 2023-02-25 ENCOUNTER — Encounter (HOSPITAL_BASED_OUTPATIENT_CLINIC_OR_DEPARTMENT_OTHER): Payer: Self-pay

## 2023-02-25 DIAGNOSIS — Z1231 Encounter for screening mammogram for malignant neoplasm of breast: Secondary | ICD-10-CM | POA: Diagnosis present

## 2023-03-30 ENCOUNTER — Other Ambulatory Visit: Payer: Self-pay | Admitting: Family Medicine

## 2023-03-30 DIAGNOSIS — I10 Essential (primary) hypertension: Secondary | ICD-10-CM

## 2023-04-16 ENCOUNTER — Other Ambulatory Visit: Payer: Self-pay | Admitting: Family Medicine

## 2023-07-22 ENCOUNTER — Ambulatory Visit: Payer: No Typology Code available for payment source | Admitting: Family Medicine

## 2023-08-12 ENCOUNTER — Ambulatory Visit: Admitting: Family Medicine

## 2023-08-27 ENCOUNTER — Telehealth: Payer: Self-pay

## 2023-08-27 NOTE — Telephone Encounter (Signed)
 Pharmacy Patient Advocate Encounter   Received notification from Fax that prior authorization for Ubrelvy  100mg  Tablets is due for renewal.   Insurance verification completed.   The patient is insured through N/A.  Action: Patient hasn't been seen in your office in over a year. Plan requires updated chart notes for PA renewal.

## 2023-10-23 ENCOUNTER — Other Ambulatory Visit: Payer: Self-pay | Admitting: Family Medicine

## 2023-10-23 NOTE — Telephone Encounter (Signed)
 According to last visit, patient is no longer taking medication. Denying request

## 2024-02-17 ENCOUNTER — Other Ambulatory Visit (HOSPITAL_BASED_OUTPATIENT_CLINIC_OR_DEPARTMENT_OTHER): Payer: Self-pay | Admitting: Family Medicine

## 2024-02-17 DIAGNOSIS — Z1231 Encounter for screening mammogram for malignant neoplasm of breast: Secondary | ICD-10-CM

## 2024-03-03 ENCOUNTER — Encounter (HOSPITAL_BASED_OUTPATIENT_CLINIC_OR_DEPARTMENT_OTHER): Payer: Self-pay

## 2024-03-03 ENCOUNTER — Inpatient Hospital Stay (HOSPITAL_BASED_OUTPATIENT_CLINIC_OR_DEPARTMENT_OTHER)
Admission: RE | Admit: 2024-03-03 | Discharge: 2024-03-03 | Disposition: A | Source: Ambulatory Visit | Attending: Family Medicine | Admitting: Family Medicine

## 2024-03-03 DIAGNOSIS — Z1231 Encounter for screening mammogram for malignant neoplasm of breast: Secondary | ICD-10-CM | POA: Diagnosis present

## 2024-04-22 ENCOUNTER — Encounter: Payer: Self-pay | Admitting: Family Medicine

## 2024-04-22 ENCOUNTER — Ambulatory Visit (INDEPENDENT_AMBULATORY_CARE_PROVIDER_SITE_OTHER): Admitting: Family Medicine

## 2024-04-22 VITALS — BP 132/84 | HR 80 | Wt 202.0 lb

## 2024-04-22 DIAGNOSIS — E785 Hyperlipidemia, unspecified: Secondary | ICD-10-CM

## 2024-04-22 DIAGNOSIS — Z0000 Encounter for general adult medical examination without abnormal findings: Secondary | ICD-10-CM

## 2024-04-22 DIAGNOSIS — E559 Vitamin D deficiency, unspecified: Secondary | ICD-10-CM

## 2024-04-22 LAB — CBC WITH DIFFERENTIAL/PLATELET
Basophils Absolute: 0 K/uL (ref 0.0–0.1)
Basophils Relative: 0.3 % (ref 0.0–3.0)
Eosinophils Absolute: 0 K/uL (ref 0.0–0.7)
Eosinophils Relative: 0.4 % (ref 0.0–5.0)
HCT: 42.8 % (ref 36.0–46.0)
Hemoglobin: 14.2 g/dL (ref 12.0–15.0)
Lymphocytes Relative: 42.1 % (ref 12.0–46.0)
Lymphs Abs: 1.9 K/uL (ref 0.7–4.0)
MCHC: 33.2 g/dL (ref 30.0–36.0)
MCV: 88.2 fl (ref 78.0–100.0)
Monocytes Absolute: 0.4 K/uL (ref 0.1–1.0)
Monocytes Relative: 10.1 % (ref 3.0–12.0)
Neutro Abs: 2.1 K/uL (ref 1.4–7.7)
Neutrophils Relative %: 47.1 % (ref 43.0–77.0)
Platelets: 253 K/uL (ref 150.0–400.0)
RBC: 4.85 Mil/uL (ref 3.87–5.11)
RDW: 13.6 % (ref 11.5–15.5)
WBC: 4.4 K/uL (ref 4.0–10.5)

## 2024-04-22 LAB — LIPID PANEL
Cholesterol: 182 mg/dL (ref 28–200)
HDL: 73.7 mg/dL
LDL Cholesterol: 92 mg/dL (ref 10–99)
NonHDL: 108.09
Total CHOL/HDL Ratio: 2
Triglycerides: 80 mg/dL (ref 10.0–149.0)
VLDL: 16 mg/dL (ref 0.0–40.0)

## 2024-04-22 LAB — BASIC METABOLIC PANEL WITH GFR
BUN: 11 mg/dL (ref 6–23)
CO2: 29 meq/L (ref 19–32)
Calcium: 9.6 mg/dL (ref 8.4–10.5)
Chloride: 104 meq/L (ref 96–112)
Creatinine, Ser: 1.1 mg/dL (ref 0.40–1.20)
GFR: 59.83 mL/min — ABNORMAL LOW
Glucose, Bld: 94 mg/dL (ref 70–99)
Potassium: 3.9 meq/L (ref 3.5–5.1)
Sodium: 139 meq/L (ref 135–145)

## 2024-04-22 LAB — HEPATIC FUNCTION PANEL
ALT: 20 U/L (ref 3–35)
AST: 21 U/L (ref 5–37)
Albumin: 4.5 g/dL (ref 3.5–5.2)
Alkaline Phosphatase: 56 U/L (ref 39–117)
Bilirubin, Direct: 0.2 mg/dL (ref 0.1–0.3)
Total Bilirubin: 1 mg/dL (ref 0.2–1.2)
Total Protein: 7.6 g/dL (ref 6.0–8.3)

## 2024-04-22 LAB — TSH: TSH: 1.16 u[IU]/mL (ref 0.35–5.50)

## 2024-04-22 LAB — VITAMIN D 25 HYDROXY (VIT D DEFICIENCY, FRACTURES): VITD: 36.67 ng/mL (ref 30.00–100.00)

## 2024-04-22 NOTE — Progress Notes (Signed)
" ° °  Subjective:    Patient ID: Holly Moyer, female    DOB: 1975-12-15, 49 y.o.   MRN: [id number]  HPI CPE- UTD on cologuard, mammo, Tdap, flu.  No concerns  Patient Care Team    Relationship Specialty Notifications Start End  Mahlon Comer BRAVO, MD PCP - General Family Medicine  06/01/16   Raford Riggs, MD PCP - Cardiology Cardiology Admissions 07/15/17   Rodgers Barnie RAMAN, CNM Referring Physician Certified Nurse Midwife  06/27/16   [facility]    01/17/23   [facility]    01/17/23   [facility]    01/17/23     Health Maintenance  Topic Date Due   Hepatitis B Vaccines 19-59 Average Risk (1 of 3 - 19+ 3-dose series) Never done   COVID-19 Vaccine (4 - Moderna risk 2025-26 season) 06/26/2024   Fecal DNA (Cologuard)  03/15/2025   Mammogram  03/03/2026   DTaP/Tdap/Td (3 - Td or Tdap) 10/13/2029   Influenza Vaccine  Completed   HPV VACCINES (No Doses Required) Completed   Hepatitis C Screening  Completed   HIV Screening  Completed   Pneumococcal Vaccine  Aged Out   Meningococcal B Vaccine  Aged Out      Review of Systems Patient reports no vision/ hearing changes, adenopathy,fever, persistant/recurrent hoarseness , swallowing issues, chest pain, palpitations, edema, persistant/recurrent cough, hemoptysis, dyspnea (rest/exertional/paroxysmal nocturnal), gastrointestinal bleeding (melena, rectal bleeding), abdominal pain, significant heartburn, bowel changes, GU symptoms (dysuria, hematuria, incontinence), Gyn symptoms (abnormal  bleeding, pain),  syncope, focal weakness, memory loss, numbness & tingling, skin/hair/nail changes, abnormal bruising or bleeding, anxiety, or depression.   + 7 lb weight gain    Objective:   Physical Exam General Appearance:    Alert, cooperative, no distress, appears stated age, obese  Head:    Normocephalic, without obvious abnormality, atraumatic  Eyes:    PERRL, conjunctiva/corneas clear, EOM's  intact both eyes  Ears:    Normal TM's and external ear canals, both ears  Nose:   Nares normal, septum midline, mucosa normal, no drainage    or sinus tenderness  Throat:   Lips, mucosa, and tongue normal; teeth and gums normal  Neck:   Supple, symmetrical, trachea midline, no adenopathy;    Thyroid : no enlargement/tenderness/nodules  Back:     Symmetric, no curvature, ROM normal, no CVA tenderness  Lungs:     Clear to auscultation bilaterally, respirations unlabored  Chest Wall:    No tenderness or deformity   Heart:    Regular rate and rhythm, S1 and S2 normal, no murmur, rub   or gallop  Breast Exam:    Deferred to GYN  Abdomen:     Soft, non-tender, bowel sounds active all four quadrants,    no masses, no organomegaly  Genitalia:    Deferred to GYN  Rectal:    Extremities:   Extremities normal, atraumatic, no cyanosis or edema  Pulses:   2+ and symmetric all extremities  Skin:   Skin color, texture, turgor normal, no rashes or lesions  Lymph nodes:   Cervical, supraclavicular, and axillary nodes normal  Neurologic:   CNII-XII intact, normal strength, sensation and reflexes    throughout          Assessment & Plan:    "

## 2024-04-22 NOTE — Patient Instructions (Signed)
Follow up in 6 months to recheck BP and cholesterol °We'll notify you of your lab results and make any changes if needed °Continue to work on healthy diet and regular exercise- you look great!!! °Call with any questions or concerns °Stay Safe!  Stay Healthy! °Happy New Year!! °

## 2024-04-22 NOTE — Assessment & Plan Note (Signed)
 Pt's PE WNL w/ exception of BMI.  UTD on cologuard, mammo, Tdap, flu.  Check labs.  Anticipatory guidance provided.

## 2024-04-23 ENCOUNTER — Ambulatory Visit: Payer: Self-pay | Admitting: Family Medicine

## 2024-04-23 ENCOUNTER — Other Ambulatory Visit: Payer: Self-pay

## 2024-04-23 MED ORDER — ROSUVASTATIN CALCIUM 10 MG PO TABS: 10.0000 mg | ORAL_TABLET | Freq: Every day | ORAL | 1 refills | Status: AC

## 2024-10-22 ENCOUNTER — Ambulatory Visit: Admitting: Family Medicine
